# Patient Record
Sex: Female | Born: 1955 | Race: White | Hispanic: Yes | Marital: Married | Smoking: Never smoker
Health system: Southern US, Community
[De-identification: ages and names within clinical notes are randomized; demographics above are authoritative.]

## PROBLEM LIST (undated history)

## (undated) DIAGNOSIS — I10 Essential (primary) hypertension: Secondary | ICD-10-CM

## (undated) DIAGNOSIS — E079 Disorder of thyroid, unspecified: Secondary | ICD-10-CM

## (undated) DIAGNOSIS — M1990 Unspecified osteoarthritis, unspecified site: Secondary | ICD-10-CM

## (undated) DIAGNOSIS — M722 Plantar fascial fibromatosis: Secondary | ICD-10-CM

## (undated) DIAGNOSIS — A150 Tuberculosis of lung: Secondary | ICD-10-CM

## (undated) DIAGNOSIS — E039 Hypothyroidism, unspecified: Secondary | ICD-10-CM

## (undated) DIAGNOSIS — E7800 Pure hypercholesterolemia, unspecified: Secondary | ICD-10-CM

## (undated) HISTORY — PX: EYE SURGERY: SHX253

## (undated) HISTORY — DX: Tuberculosis of lung: A15.0

## (undated) HISTORY — DX: Hypothyroidism, unspecified: E03.9

## (undated) HISTORY — DX: Plantar fascial fibromatosis: M72.2

## (undated) HISTORY — PX: TUBAL LIGATION: SHX77

## (undated) HISTORY — PX: OTHER SURGICAL HISTORY: SHX169

---

## 2001-05-28 ENCOUNTER — Ambulatory Visit (HOSPITAL_COMMUNITY): Admission: RE | Admit: 2001-05-28 | Discharge: 2001-05-28 | Payer: Self-pay | Admitting: Family Medicine

## 2001-05-28 ENCOUNTER — Encounter: Payer: Self-pay | Admitting: Family Medicine

## 2001-06-10 ENCOUNTER — Encounter: Payer: Self-pay | Admitting: Family Medicine

## 2001-06-10 ENCOUNTER — Ambulatory Visit (HOSPITAL_COMMUNITY): Admission: RE | Admit: 2001-06-10 | Discharge: 2001-06-10 | Payer: Self-pay | Admitting: Family Medicine

## 2001-11-30 ENCOUNTER — Ambulatory Visit (HOSPITAL_COMMUNITY): Admission: RE | Admit: 2001-11-30 | Discharge: 2001-11-30 | Payer: Self-pay | Admitting: Family Medicine

## 2001-11-30 ENCOUNTER — Encounter: Payer: Self-pay | Admitting: Family Medicine

## 2004-02-07 ENCOUNTER — Ambulatory Visit (HOSPITAL_COMMUNITY): Admission: RE | Admit: 2004-02-07 | Discharge: 2004-02-07 | Payer: Self-pay | Admitting: Family Medicine

## 2005-02-11 ENCOUNTER — Ambulatory Visit (HOSPITAL_COMMUNITY): Admission: RE | Admit: 2005-02-11 | Discharge: 2005-02-11 | Payer: Self-pay | Admitting: Family Medicine

## 2006-02-16 ENCOUNTER — Ambulatory Visit (HOSPITAL_COMMUNITY): Admission: RE | Admit: 2006-02-16 | Discharge: 2006-02-16 | Payer: Self-pay | Admitting: Family Medicine

## 2007-02-19 ENCOUNTER — Ambulatory Visit (HOSPITAL_COMMUNITY): Admission: RE | Admit: 2007-02-19 | Discharge: 2007-02-19 | Payer: Self-pay | Admitting: Family Medicine

## 2008-03-03 ENCOUNTER — Ambulatory Visit (HOSPITAL_COMMUNITY): Admission: RE | Admit: 2008-03-03 | Discharge: 2008-03-03 | Payer: Self-pay | Admitting: Family Medicine

## 2009-03-07 ENCOUNTER — Ambulatory Visit (HOSPITAL_COMMUNITY): Admission: RE | Admit: 2009-03-07 | Discharge: 2009-03-07 | Payer: Self-pay | Admitting: Family Medicine

## 2010-03-26 ENCOUNTER — Ambulatory Visit (HOSPITAL_COMMUNITY): Admission: RE | Admit: 2010-03-26 | Discharge: 2010-03-26 | Payer: Self-pay | Admitting: Family Medicine

## 2010-09-02 ENCOUNTER — Ambulatory Visit: Payer: Self-pay | Admitting: Internal Medicine

## 2010-09-02 DIAGNOSIS — M129 Arthropathy, unspecified: Secondary | ICD-10-CM | POA: Insufficient documentation

## 2010-09-02 DIAGNOSIS — E039 Hypothyroidism, unspecified: Secondary | ICD-10-CM

## 2010-09-02 DIAGNOSIS — K5909 Other constipation: Secondary | ICD-10-CM | POA: Insufficient documentation

## 2010-09-02 DIAGNOSIS — K921 Melena: Secondary | ICD-10-CM | POA: Insufficient documentation

## 2010-09-03 ENCOUNTER — Encounter: Payer: Self-pay | Admitting: Internal Medicine

## 2010-09-16 ENCOUNTER — Ambulatory Visit (HOSPITAL_COMMUNITY)
Admission: RE | Admit: 2010-09-16 | Discharge: 2010-09-16 | Payer: Self-pay | Source: Home / Self Care | Admitting: Internal Medicine

## 2010-11-12 NOTE — Letter (Signed)
Summary: TCS ORDER  TCS ORDER   Imported By: Ave Filter 09/03/2010 08:06:04  _____________________________________________________________________  External Attachment:    Type:   Image     Comment:   External Document

## 2010-11-14 NOTE — Assessment & Plan Note (Signed)
Summary: RECTAL BLEEDING/LAW   Visit Type:  Initial Consult Referring Provider:  Sherie Don Primary Care Provider:  Sherie Don, NP  Chief Complaint:  rectal bleeding.  History of Present Illness: 55 y/o hispanic female here for evaluation to set up colonoscopy because she noticed a small amt of hematochezia a couple of times, bright fresh blood, with a BM after much straining.  hx chronic constipation w/ daily BM otherwse.  Taking diclofenac daily for arthritis x 6 years.  Hx hypothyroidism dx 1 month ago. Advil 2  1-2 days per week.  Denies proctalgia or abd pain.  The patient denies any upper GI symptoms which include nausea, vomiting, heartburn, indigestion, dysphagia, odynophagia, anorexia or early satiety.  Weight stable.   [employer] [phone]     Current Problems (verified): 1)  Arthritis  (ICD-716.90) 2)  Hematochezia  (ICD-578.1) 3)  Hypothyroidism  (ICD-244.9)  Current Medications (verified): 1)  Levothroid 50 Mcg Tabs (Levothyroxine Sodium) .... Take 1 Tablet By Mouth Once A Day 2)  Voltaren-Xr 100 Mg Xr24h-Tab (Diclofenac Sodium) .... Take 1 Tablet By Mouth Once A Day 3)  Multi-Vitamins .... Take 1 Tablet By Mouth Once A Day  Allergies (verified): No Known Drug Allergies  Past History:  Past Medical History: ARTHRITIS (ICD-716.90) HEMATOCHEZIA (ICD-578.1) HYPOTHYROIDISM (ICD-244.9)  Past Surgical History: Unremarkable  Family History: No known family history of colorectal carcinoma, IBD, liver or chronic GI problems.  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  Alcohol Use - no Illicit Drug Use - no Patient does not get regular exercise.  Does Patient Exercise:  no Smoking Status:  never Drug Use:  no  Review of Systems General:  Denies fever, chills, sweats, anorexia, fatigue, weakness, malaise, weight loss, and sleep disorder. CV:  Denies chest pains, angina, palpitations, syncope, dyspnea  on exertion, orthopnea, PND, peripheral edema, and claudication. GI:  Denies difficulty swallowing, pain on swallowing, indigestion/heartburn, vomiting blood, jaundice, and black BMs. GU:  Denies urinary burning, blood in urine, nocturnal urination, urinary frequency, urinary incontinence, abnormal vaginal bleeding, amenorrhea, menorrhagia, vaginal discharge, pelvic pain, genital sores, painful intercourse, and decreased libido. MS:  Denies joint pain / LOM, joint swelling, joint stiffness, joint deformity, low back pain, muscle weakness, muscle cramps, muscle atrophy, leg pain at night, leg pain with exertion, and shoulder pain / LOM hand / wrist pain (CTS). Derm:  Denies rash, itching, dry skin, hives, moles, warts, and unhealing ulcers. Psych:  Denies depression, anxiety, memory loss, suicidal ideation, hallucinations, paranoia, phobia, and confusion. Heme:  Denies bruising and enlarged lymph nodes.  Vital Signs:  Patient profile:   55 year old female Height:      57 inches Weight:      139 pounds BMI:     30.19 Temp:     98.4 degrees F oral Pulse rate:   64 / minute BP sitting:   120 / 80  (left arm) Cuff size:   regular  Vitals Entered By: Cloria Spring LPN (September 02, 2010 2:22 PM)  Physical Exam  General:  Well developed, well nourished, no acute distress. Head:  Normocephalic and atraumatic. Eyes:  Sclera clear, no icterus. Ears:  Normal auditory acuity. Nose:  No deformity, discharge,  or lesions. Mouth:  No deformity or lesions, dentition normal. Neck:  Supple; no masses or thyromegaly. Chest Wall:  Symmetrical;  no deformities or tenderness. Lungs:  Clear throughout to auscultation. Heart:  Regular rate and rhythm; no murmurs, rubs,  or bruits. Abdomen:  Soft, nontender and nondistended. No masses, hepatosplenomegaly or hernias noted. Normal bowel sounds.without guarding and without rebound.   Rectal:  deferred until time of colonoscopy.   Msk:  Symmetrical with no  gross deformities. Normal posture. Pulses:  Normal pulses noted. Extremities:  No clubbing, cyanosis, edema or deformities noted. Neurologic:  Alert and  oriented x4;  grossly normal neurologically. Skin:  Intact without significant lesions or rashes. Cervical Nodes:  No significant cervical adenopathy. Psych:  Alert and cooperative. Normal mood and affect.  Impression & Recommendations:  Problem # 1:  HEMATOCHEZIA (ICD-578.1)  54 y/o hispanic female w/ intermittant small volume hematochezia in setting of chronic constipation & recent diagnosis of hypothyroidism.  Will need colonoscopy to determine source and r/o colorectal ca.  Diagnostic colonoscopy to be performed by Dr. Suszanne Conners Rourk in the near future.  I have discussed risks and benefits which include, but are not limited to, bleeding, infection, perforation, or medication reaction.  The patient agrees with this plan and consent will be obtained.  Orders: Consultation Level III (44010)  Problem # 2:  CONSTIPATION, CHRONIC (ICD-564.09) Would consider Miralax if continues, she has had daily BMs however since on thyroid meds x 1 month.

## 2011-03-06 ENCOUNTER — Other Ambulatory Visit: Payer: Self-pay | Admitting: Family Medicine

## 2011-03-06 DIAGNOSIS — Z139 Encounter for screening, unspecified: Secondary | ICD-10-CM

## 2011-04-01 ENCOUNTER — Ambulatory Visit (HOSPITAL_COMMUNITY)
Admission: RE | Admit: 2011-04-01 | Discharge: 2011-04-01 | Disposition: A | Discharge status: Home / Self Care | Payer: 59 | Source: Ambulatory Visit | Attending: Family Medicine | Admitting: Family Medicine

## 2011-04-01 DIAGNOSIS — Z139 Encounter for screening, unspecified: Secondary | ICD-10-CM

## 2011-04-01 DIAGNOSIS — Z1231 Encounter for screening mammogram for malignant neoplasm of breast: Secondary | ICD-10-CM | POA: Insufficient documentation

## 2012-04-02 ENCOUNTER — Other Ambulatory Visit: Payer: Self-pay | Admitting: Family Medicine

## 2012-04-02 DIAGNOSIS — Z139 Encounter for screening, unspecified: Secondary | ICD-10-CM

## 2012-04-09 ENCOUNTER — Ambulatory Visit (HOSPITAL_COMMUNITY)
Admission: RE | Admit: 2012-04-09 | Discharge: 2012-04-09 | Disposition: A | Discharge status: Home / Self Care | Payer: 59 | Source: Ambulatory Visit | Attending: Family Medicine | Admitting: Family Medicine

## 2012-04-09 DIAGNOSIS — Z139 Encounter for screening, unspecified: Secondary | ICD-10-CM

## 2012-04-13 ENCOUNTER — Ambulatory Visit (HOSPITAL_COMMUNITY)
Admission: RE | Admit: 2012-04-13 | Discharge: 2012-04-13 | Disposition: A | Discharge status: Home / Self Care | Payer: 59 | Source: Ambulatory Visit | Attending: Family Medicine | Admitting: Family Medicine

## 2012-04-13 DIAGNOSIS — Z1231 Encounter for screening mammogram for malignant neoplasm of breast: Secondary | ICD-10-CM | POA: Insufficient documentation

## 2012-10-21 ENCOUNTER — Emergency Department (HOSPITAL_COMMUNITY)
Admission: EM | Admit: 2012-10-21 | Discharge: 2012-10-21 | Disposition: A | Discharge status: Home / Self Care | Payer: 59 | Attending: Emergency Medicine | Admitting: Emergency Medicine

## 2012-10-21 ENCOUNTER — Emergency Department (HOSPITAL_COMMUNITY): Payer: 59

## 2012-10-21 ENCOUNTER — Encounter (HOSPITAL_COMMUNITY): Payer: Self-pay | Admitting: *Deleted

## 2012-10-21 DIAGNOSIS — G43909 Migraine, unspecified, not intractable, without status migrainosus: Secondary | ICD-10-CM | POA: Insufficient documentation

## 2012-10-21 DIAGNOSIS — Z79899 Other long term (current) drug therapy: Secondary | ICD-10-CM | POA: Insufficient documentation

## 2012-10-21 DIAGNOSIS — H53149 Visual discomfort, unspecified: Secondary | ICD-10-CM | POA: Insufficient documentation

## 2012-10-21 DIAGNOSIS — I10 Essential (primary) hypertension: Secondary | ICD-10-CM | POA: Insufficient documentation

## 2012-10-21 DIAGNOSIS — Z8739 Personal history of other diseases of the musculoskeletal system and connective tissue: Secondary | ICD-10-CM | POA: Insufficient documentation

## 2012-10-21 DIAGNOSIS — E079 Disorder of thyroid, unspecified: Secondary | ICD-10-CM | POA: Insufficient documentation

## 2012-10-21 DIAGNOSIS — E7800 Pure hypercholesterolemia, unspecified: Secondary | ICD-10-CM | POA: Insufficient documentation

## 2012-10-21 HISTORY — DX: Essential (primary) hypertension: I10

## 2012-10-21 HISTORY — DX: Unspecified osteoarthritis, unspecified site: M19.90

## 2012-10-21 HISTORY — DX: Pure hypercholesterolemia, unspecified: E78.00

## 2012-10-21 HISTORY — DX: Disorder of thyroid, unspecified: E07.9

## 2012-10-21 MED ORDER — DEXAMETHASONE SODIUM PHOSPHATE 4 MG/ML IJ SOLN
10.0000 mg | Freq: Once | INTRAMUSCULAR | Status: AC
  Administered 2012-10-21: 12 mg via INTRAMUSCULAR
  Filled 2012-10-21: qty 3

## 2012-10-21 MED ORDER — HYDROCODONE-ACETAMINOPHEN 5-325 MG PO TABS: 1.0000 | ORAL_TABLET | Freq: Four times a day (QID) | ORAL | Status: AC | PRN

## 2012-10-21 MED ORDER — METOCLOPRAMIDE HCL 5 MG/ML IJ SOLN
10.0000 mg | Freq: Once | INTRAMUSCULAR | Status: AC
  Administered 2012-10-21: 10 mg via INTRAMUSCULAR
  Filled 2012-10-21: qty 2

## 2012-10-21 MED ORDER — DIPHENHYDRAMINE HCL 25 MG PO CAPS
50.0000 mg | ORAL_CAPSULE | Freq: Once | ORAL | Status: AC
  Administered 2012-10-21: 50 mg via ORAL
  Filled 2012-10-21: qty 2

## 2012-10-21 NOTE — ED Provider Notes (Signed)
Medical screening examination/treatment/procedure(s) were performed by non-physician practitioner and as supervising physician I was immediately available for consultation/collaboration.   Ayaansh Smail W Jayliah Benett, MD 10/21/12 2353 

## 2012-10-21 NOTE — ED Provider Notes (Signed)
History     CSN: [id number]  Arrival date & time 10/21/12  1901   First MD Initiated Contact with Patient 10/21/12 2104      Chief Complaint  Patient presents with  . Headache    (Consider location/radiation/quality/duration/timing/severity/associated sxs/prior treatment) HPI Comments: States she used to have headaches many years ago and they were always occipital in presentation.  This has been frontal only.  Had a ormal head CT she thinks about 10-15 years ago.    No head trauma.  No change in LOC, behavior or coordination.  Patient is a 57 y.o. female presenting with headaches. The history is provided by the patient. A language interpreter was used (daughter translating spanis/english for pt and me.).  Headache  This is a new problem. Episode onset: 1 wk ago. The problem occurs constantly. The problem has not changed since onset.The headache is associated with bright light. The pain is located in the frontal region. The quality of the pain is described as throbbing. The pain is severe. The pain does not radiate. Pertinent negatives include no fever, no near-syncope, no nausea and no vomiting. She has tried acetaminophen and NSAIDs for the symptoms. The treatment provided no relief.    Past Medical History  Diagnosis Date  . Arthritis   . Thyroid disease   . Hypertension   . Hypercholesteremia     Past Surgical History  Procedure Date  . Eye surgery   . 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 No pertinent family history.  History  Substance Use Topics  . Smoking status: Never Smoker   . Smokeless tobacco: Not on file  . Alcohol Use: No    OB History    Grav Para Term Preterm Abortions TAB SAB Ect Mult Living                  Review of Systems  Constitutional: Negative for fever and chills.  Eyes: Positive for photophobia. Negative for visual disturbance.  Cardiovascular: Negative for near-syncope.  Gastrointestinal: Negative for nausea and  vomiting.  Neurological: Positive for headaches.  Psychiatric/Behavioral: Negative for confusion and decreased concentration.  All other systems reviewed and are negative.    Allergies  Review of patient's allergies indicates no known allergies.  Home Medications   Current Outpatient Rx  Name  Route  Sig  Dispense  Refill  . CARBOXYMETHYLCELLULOSE SODIUM 0.25 % OP SOLN   Ophthalmic   Apply 1 drop to eye daily as needed. For lubricant         . DICLOFENAC SODIUM 75 MG PO TBEC   Oral   Take 75 mg by mouth 2 (two) times daily with a meal.         . OMEGA-3 FATTY ACIDS 1000 MG PO CAPS   Oral   Take 2 g by mouth daily.         Marland Kitchen HYDROCODONE-ACETAMINOPHEN 5-325 MG PO TABS   Oral   Take 1 tablet by mouth every 4 (four) hours as needed. For headache pain         . LEVOTHYROXINE SODIUM 100 MCG PO TABS   Oral   Take 100 mcg by mouth daily.         Marland Kitchen HYDROCODONE-ACETAMINOPHEN 5-325 MG PO TABS   Oral   Take 1 tablet by mouth every 6 (six) hours as needed for pain.   20 tablet   0     BP 127/81  Pulse 93  Temp 99.7  F (37.6 C) (Oral)  Resp 18  Ht 4\' 10"  (1.473 m)  Wt 135 lb (61.236 kg)  BMI 28.22 kg/m2  SpO2 100%  Physical Exam  Nursing note and vitals reviewed. Constitutional: She is oriented to person, place, and time. She appears well-developed and well-nourished. No distress.  HENT:  Head: Normocephalic and atraumatic.  Eyes: EOM are normal. Pupils are equal, round, and reactive to light.  Neck: Normal range of motion.  Cardiovascular: Normal rate and regular rhythm.   Pulmonary/Chest: Effort normal.  Abdominal: Soft. She exhibits no distension. There is no tenderness.  Musculoskeletal: Normal range of motion.  Neurological: She is alert and oriented to person, place, and time. She has normal strength. No cranial nerve deficit or sensory deficit. Coordination and gait normal. GCS eye subscore is 4. GCS verbal subscore is 5. GCS motor subscore is 6.    Skin: Skin is warm and dry.  Psychiatric: She has a normal mood and affect. Judgment normal.    ED Course  Procedures (including critical care time)  Labs Reviewed - No data to display Ct Head Wo Contrast  10/21/2012  *RADIOLOGY REPORT*  Clinical Data: Severe headache for 1 week  CT HEAD WITHOUT CONTRAST  Technique:  Contiguous axial images were obtained from the base of the skull through the vertex without contrast.  Comparison: None.  Findings: There is no evidence for acute infarction, intracranial hemorrhage, mass lesion, hydrocephalus, or extra-axial fluid.  No atrophy or white matter disease.  Calvarium intact.  Clear sinuses and mastoids.  IMPRESSION: Negative exam.   Original Report Authenticated By: Davonna Belling, M.D.      1. Migraine headache       MDM  Nl head CT rx-hydrocodone, 20 F/u with dr. Gerda Diss prn        Evalina Field, PA 10/21/12 2317

## 2012-10-21 NOTE — ED Notes (Signed)
Headache for 1 week, seen by her doctor.Taking vicodin without relief.  Nausea, no vomiting.  No injury to head

## 2013-03-22 ENCOUNTER — Other Ambulatory Visit: Payer: Self-pay | Admitting: Family Medicine

## 2013-03-22 DIAGNOSIS — Z139 Encounter for screening, unspecified: Secondary | ICD-10-CM

## 2013-04-18 ENCOUNTER — Ambulatory Visit (HOSPITAL_COMMUNITY)
Admission: RE | Admit: 2013-04-18 | Discharge: 2013-04-18 | Disposition: A | Discharge status: Home / Self Care | Payer: 59 | Source: Ambulatory Visit | Attending: Family Medicine | Admitting: Family Medicine

## 2013-04-18 DIAGNOSIS — Z1231 Encounter for screening mammogram for malignant neoplasm of breast: Secondary | ICD-10-CM | POA: Insufficient documentation

## 2013-04-18 DIAGNOSIS — Z139 Encounter for screening, unspecified: Secondary | ICD-10-CM

## 2013-05-07 ENCOUNTER — Encounter: Payer: Self-pay | Admitting: *Deleted

## 2013-05-11 ENCOUNTER — Encounter: Payer: Self-pay | Admitting: Nurse Practitioner

## 2013-05-25 ENCOUNTER — Telehealth: Payer: Self-pay | Admitting: Nurse Practitioner

## 2013-05-25 ENCOUNTER — Ambulatory Visit (INDEPENDENT_AMBULATORY_CARE_PROVIDER_SITE_OTHER): Payer: Managed Care, Other (non HMO) | Admitting: Nurse Practitioner

## 2013-05-25 ENCOUNTER — Encounter: Payer: Self-pay | Admitting: Nurse Practitioner

## 2013-05-25 VITALS — BP 118/76 | Ht <= 58 in | Wt 137.8 lb

## 2013-05-25 DIAGNOSIS — Z0000 Encounter for general adult medical examination without abnormal findings: Secondary | ICD-10-CM

## 2013-05-25 DIAGNOSIS — Z01419 Encounter for gynecological examination (general) (routine) without abnormal findings: Secondary | ICD-10-CM

## 2013-05-25 DIAGNOSIS — M722 Plantar fascial fibromatosis: Secondary | ICD-10-CM | POA: Insufficient documentation

## 2013-05-25 DIAGNOSIS — Z79899 Other long term (current) drug therapy: Secondary | ICD-10-CM

## 2013-05-25 DIAGNOSIS — E782 Mixed hyperlipidemia: Secondary | ICD-10-CM

## 2013-05-25 DIAGNOSIS — R5381 Other malaise: Secondary | ICD-10-CM

## 2013-05-25 NOTE — Patient Instructions (Addendum)
Vitamin D 1000 units per day Calcium 1200 mg per dayFascitis plantar (Plantar Fascitis) La fascitis plantar es un trastorno frecuente que ocasiona dolor en el pie. Se trata de una inflamacin (irritacin) de la banda de tejidos fibrosos y resistentes que se extienden desde el hueso del taln (calcneo) hasta la parte anterior de la planta del pie. Esta inflamacin puede deberse a que ha permanecido [employer], a zapatos que no Holiday representative, a correr [employer], al sobrepeso, a un andar anormal y el uso excesivo del pie con dolor (esto es frecuente en los corredores). Tambin es frecuente [employer] que practican ejercicios aerbicos y [place] bailarines de ballet. SNTOMAS La persona que sufre fascitis plantar manifiesta:  Dolor intenso por la [employer]ant parte inferior del pie, especialmente al dar los primeros pasos luego de levantarse de la cama. Este dolor disminuye luego de caminar algunos minutos.  Dolor intenso al caminar [employer] de un tiempo prolongado de inactividad.  El dolor empeora al caminar descalzo o al subir escaleras. DIAGNSTICO  El mdico har el diagnstico examinando sus pies.  En general no es necesario indicar radiografas. PREVENCIN  Consulte a Nurse, mental health en medicina del deporte antes de comenzar un nuevo programa de ejercicios.  Los programas de caminatas ofrecen un buen entrenamiento. Hay una menor probabilidad de sufrir lesiones por uso excesivo, que son frecuentes [place] personas que corren. Hay menos impacto y menos agresin a las articulaciones.  Comience lentamente todo nuevo programa de ejercicios. Si aparece algn problema o dolor, disminuya la cantidad de tiempo o la distancia hasta que se encuentre cmodo.  Use calzado de buena calidad y reemplcelo regularmente.  Estire el pie y los ligamentos que se encuentran en la parte posterior del tobillo (tendn de [place]) antes y despus de Education officer, environmental actividad  fsica.  Corra o practique ejercicios sobre superficies parejas que no sean duras. Por ejemplo, el asfalto es mejor que el pavimento.  No corra descalzo sobre superficies duras.  Si camina sobre cinta, vare la inclinacin.  No siga con el entrenamiento si tiene problemas en el pie o en la articulacin. Busque ayuda profesional si no mejora. INSTRUCCIONES PARA EL CUIDADO DOMICILIARIO  Evite las SUPERVALU INC causan dolor hasta que se recupere.  Use hielo o compresas fras sobre las zonas doloridas despus de Education officer, environmental ejercicios.  Only take over-the-counter or prescription medicines for pain, discomfort, or fever as directed by your caregiver.  Las plantillas blandas o las zapatillas con base de aire o gel pueden ser de [place].  Si los problemas continan o se agravan, consulte a Music therapist en medicina del deporte o con su mdico personal. La cortisona es un potente antiinflamatorio que puede inyectarse en la zona dolorida. El profesional que lo asiste comentar este tratamiento con usted. EST SEGURO QUE:   Comprende las instrucciones para el alta mdica.  Controlar su enfermedad.  Solicitar atencin mdica de inmediato segn las indicaciones. Document Released: 07/09/2005 Document Revised: 12/22/2011 [facility] Patient Information 2014 [place], [place].

## 2013-05-25 NOTE — Telephone Encounter (Signed)
Jamie Torres has chart.   Patient forgot to ask for refills on medication during her PE on 05/25/2013.    levothyroxine (SYNTHROID, LEVOTHROID) 100 MCG tablet  diclofenac (VOLTAREN) 75 MG EC tablet  [facility]  Please call Patient. Thanks

## 2013-05-25 NOTE — Progress Notes (Signed)
  Subjective:    Patient ID: Jamie Torres, female    DOB: 1956-04-07, 57 y.o.   MRN: [id number]  HPI Presents for wellness exam.  Daughter is acting as interpreter. Healthy diet. Regular vision and dental exams.  Pain in right heel. Worse first thing in morning and when first standing.    Review of Systems  Constitutional: Negative for fever, activity change, appetite change and fatigue.  HENT: Negative for hearing loss, ear pain, congestion, sore throat, rhinorrhea and dental problem.   Eyes: Negative for visual disturbance.  Respiratory: Negative for cough, chest tightness and shortness of breath.   Cardiovascular: Negative for chest pain and palpitations.  Gastrointestinal: Negative for nausea, vomiting, abdominal pain, diarrhea and constipation.  Genitourinary: Positive for frequency. Negative for dysuria, urgency, vaginal bleeding, vaginal discharge, difficulty urinating, menstrual problem and pelvic pain.  Musculoskeletal: Positive for myalgias.  Neurological: Negative for headaches.  Psychiatric/Behavioral: Negative for sleep disturbance and dysphoric mood. The patient is not nervous/anxious.        Objective:   Physical Exam  Vitals reviewed. Constitutional: She is oriented to person, place, and time. She appears well-developed. No distress.  HENT:  Right Ear: External ear normal.  Left Ear: External ear normal.  Mouth/Throat: Oropharynx is clear and moist.  Neck: Normal range of motion. Neck supple. No tracheal deviation present. No thyromegaly present.  Cardiovascular: Normal rate, regular rhythm and normal heart sounds.  Exam reveals no gallop.   No murmur heard. Pulmonary/Chest: Effort normal and breath sounds normal. No respiratory distress.  Abdominal: Soft. She exhibits no distension and no mass. There is no tenderness.  Genitourinary: Uterus normal.  Musculoskeletal: She exhibits no edema.  Lymphadenopathy:    She has no cervical adenopathy.  Neurological: She  is alert and oriented to person, place, and time.  Skin: Skin is warm and dry. No rash noted.  Psychiatric: She has a normal mood and affect. Her behavior is normal.  Breast exam: no masses; Axillae no adenopathy. External GU: normal Bimanual exam normal; no masses; ovaries nonpalpable Rectal exam: normal, no stool for hemoccult.  sole of right foot distal part of heel tender to palpation        Assessment & Plan:  Well woman exam  Other malaise and fatigue - Plan: CBC with Differential, TSH, Vitamin D 25 hydroxy  Mixed hyperlipidemia - Plan: Lipid panel  Encounter for long-term (current) use of other medications - Plan: Hepatic function panel, Basic metabolic panel  Routine general medical examination at a health care facility - Plan: POC Hemoccult Bld/Stl (3-Cd Home Screen), CANCELED: MM Digital Screening  Plantar fasciitis of right foot  Given information on plantar fasciitis.  Recommend office visit if persists.  Recommend vitamin D and calcium supplement.  Next PE in one year.

## 2013-05-26 ENCOUNTER — Other Ambulatory Visit: Payer: Self-pay | Admitting: Nurse Practitioner

## 2013-05-26 NOTE — Telephone Encounter (Signed)
Left message at home number on machine that Jamie Torres would like to get labs back first to see if she needs same dose of thyroid medication.

## 2013-05-26 NOTE — Telephone Encounter (Signed)
Would like to get labs back first to see if she needs same dose of thyroid medication

## 2013-05-30 LAB — LIPID PANEL
LDL Cholesterol: 144 mg/dL — ABNORMAL HIGH (ref 0–99)
Total CHOL/HDL Ratio: 6.5 Ratio

## 2013-05-30 LAB — HEPATIC FUNCTION PANEL
Albumin: 4 g/dL (ref 3.5–5.2)
Indirect Bilirubin: 0.3 mg/dL (ref 0.0–0.9)
Total Bilirubin: 0.4 mg/dL (ref 0.3–1.2)
Total Protein: 6.7 g/dL (ref 6.0–8.3)

## 2013-05-30 LAB — CBC WITH DIFFERENTIAL/PLATELET
Eosinophils Relative: 2 % (ref 0–5)
Hemoglobin: 13.3 g/dL (ref 12.0–15.0)
Lymphocytes Relative: 30 % (ref 12–46)
Lymphs Abs: 2 10*3/uL (ref 0.7–4.0)
MCHC: 33.1 g/dL (ref 30.0–36.0)
MCV: 86.3 fL (ref 78.0–100.0)
Monocytes Absolute: 0.5 10*3/uL (ref 0.1–1.0)
Platelets: 239 10*3/uL (ref 150–400)
RBC: 4.66 MIL/uL (ref 3.87–5.11)
RDW: 14.7 % (ref 11.5–15.5)
WBC: 6.9 10*3/uL (ref 4.0–10.5)

## 2013-05-30 LAB — BASIC METABOLIC PANEL
Calcium: 8.9 mg/dL (ref 8.4–10.5)
Chloride: 106 mEq/L (ref 96–112)
Potassium: 4.4 mEq/L (ref 3.5–5.3)
Sodium: 139 mEq/L (ref 135–145)

## 2013-05-30 LAB — TSH: TSH: 1.149 u[IU]/mL (ref 0.350–4.500)

## 2013-06-02 ENCOUNTER — Encounter: Payer: Self-pay | Admitting: Nurse Practitioner

## 2013-06-03 ENCOUNTER — Other Ambulatory Visit (INDEPENDENT_AMBULATORY_CARE_PROVIDER_SITE_OTHER): Payer: Managed Care, Other (non HMO) | Admitting: *Deleted

## 2013-06-03 DIAGNOSIS — Z0000 Encounter for general adult medical examination without abnormal findings: Secondary | ICD-10-CM

## 2013-06-03 LAB — POC HEMOCCULT BLD/STL (HOME/3-CARD/SCREEN): Fecal Occult Blood, POC: NEGATIVE

## 2013-07-01 ENCOUNTER — Other Ambulatory Visit: Payer: Self-pay | Admitting: Family Medicine

## 2013-07-01 NOTE — Telephone Encounter (Signed)
Last office visit 05/25/13

## 2014-03-16 ENCOUNTER — Other Ambulatory Visit: Payer: Self-pay | Admitting: Family Medicine

## 2014-03-16 DIAGNOSIS — Z1231 Encounter for screening mammogram for malignant neoplasm of breast: Secondary | ICD-10-CM

## 2014-04-16 ENCOUNTER — Other Ambulatory Visit: Payer: Self-pay | Admitting: Family Medicine

## 2014-04-20 ENCOUNTER — Ambulatory Visit (HOSPITAL_COMMUNITY)
Admission: RE | Admit: 2014-04-20 | Discharge: 2014-04-20 | Disposition: A | Discharge status: Home / Self Care | Payer: BC Managed Care – PPO | Source: Ambulatory Visit | Attending: Family Medicine | Admitting: Family Medicine

## 2014-04-20 DIAGNOSIS — Z1231 Encounter for screening mammogram for malignant neoplasm of breast: Secondary | ICD-10-CM

## 2014-04-24 ENCOUNTER — Other Ambulatory Visit: Payer: Self-pay

## 2014-04-24 MED ORDER — LEVOTHYROXINE SODIUM 100 MCG PO TABS: ORAL_TABLET | ORAL | Status: DC

## 2014-05-25 ENCOUNTER — Other Ambulatory Visit: Payer: Self-pay | Admitting: Family Medicine

## 2014-06-28 ENCOUNTER — Other Ambulatory Visit: Payer: Self-pay | Admitting: Family Medicine

## 2014-07-17 ENCOUNTER — Encounter: Payer: Managed Care, Other (non HMO) | Admitting: Nurse Practitioner

## 2014-07-27 ENCOUNTER — Encounter: Payer: Self-pay | Admitting: Nurse Practitioner

## 2014-07-27 ENCOUNTER — Ambulatory Visit (INDEPENDENT_AMBULATORY_CARE_PROVIDER_SITE_OTHER): Payer: BC Managed Care – PPO | Admitting: Nurse Practitioner

## 2014-07-27 VITALS — BP 120/80 | Ht <= 58 in | Wt 138.0 lb

## 2014-07-27 DIAGNOSIS — Z0000 Encounter for general adult medical examination without abnormal findings: Secondary | ICD-10-CM

## 2014-07-27 DIAGNOSIS — E782 Mixed hyperlipidemia: Secondary | ICD-10-CM

## 2014-07-27 DIAGNOSIS — G4700 Insomnia, unspecified: Secondary | ICD-10-CM

## 2014-07-27 DIAGNOSIS — Z01419 Encounter for gynecological examination (general) (routine) without abnormal findings: Secondary | ICD-10-CM

## 2014-07-27 DIAGNOSIS — Z79899 Other long term (current) drug therapy: Secondary | ICD-10-CM

## 2014-07-27 DIAGNOSIS — K5909 Other constipation: Secondary | ICD-10-CM

## 2014-07-27 DIAGNOSIS — E039 Hypothyroidism, unspecified: Secondary | ICD-10-CM

## 2014-07-27 MED ORDER — ALPRAZOLAM 0.5 MG PO TABS: ORAL_TABLET | ORAL | Status: DC

## 2014-07-28 ENCOUNTER — Encounter: Payer: Self-pay | Admitting: Nurse Practitioner

## 2014-07-28 DIAGNOSIS — G4700 Insomnia, unspecified: Secondary | ICD-10-CM | POA: Insufficient documentation

## 2014-07-28 LAB — TSH: TSH: 1.544 u[IU]/mL (ref 0.350–4.500)

## 2014-07-28 LAB — LIPID PANEL
CHOL/HDL RATIO: 5.7 ratio
CHOLESTEROL: 205 mg/dL — AB (ref 0–200)
HDL: 36 mg/dL — AB (ref >39)
LDL Cholesterol: 136 mg/dL — ABNORMAL HIGH (ref 0–99)
Triglycerides: 163 mg/dL — ABNORMAL HIGH (ref <150)
VLDL: 33 mg/dL (ref 0–40)

## 2014-07-28 LAB — BASIC METABOLIC PANEL
BUN: 23 mg/dL (ref 6–23)
CHLORIDE: 108 meq/L (ref 96–112)
CO2: 23 meq/L (ref 19–32)
Calcium: 8.8 mg/dL (ref 8.4–10.5)
Creat: 0.71 mg/dL (ref 0.50–1.10)
Glucose, Bld: 88 mg/dL (ref 70–99)
POTASSIUM: 4.1 meq/L (ref 3.5–5.3)
Sodium: 138 mEq/L (ref 135–145)

## 2014-07-28 LAB — HEPATIC FUNCTION PANEL
ALT: 38 U/L — ABNORMAL HIGH (ref 0–35)
AST: 26 U/L (ref 0–37)
Albumin: 3.8 g/dL (ref 3.5–5.2)
Alkaline Phosphatase: 79 U/L (ref 39–117)
BILIRUBIN INDIRECT: 0.3 mg/dL (ref 0.2–1.2)
Bilirubin, Direct: 0.1 mg/dL (ref 0.0–0.3)
Total Bilirubin: 0.4 mg/dL (ref 0.2–1.2)
Total Protein: 6.6 g/dL (ref 6.0–8.3)

## 2014-07-28 NOTE — Progress Notes (Signed)
Subjective:    Patient ID: Jamie Torres, female    DOB: Jan 16, 1956, 58 y.o.   MRN: [id number]  HPI presents for her wellness exam. Her daughter is here as interpreter. Regular vision and dental exams. Married, same sexual partner. No vaginal bleeding or pelvic pain. Eats healthy diet. Regular walking. Having trouble sleeping, specifically early morning awakenings.   Review of Systems  Constitutional: Negative for fever, activity change, appetite change and fatigue.  HENT: Negative for dental problem, ear pain, sinus pressure and sore throat.   Respiratory: Negative for cough, chest tightness, shortness of breath and wheezing.   Cardiovascular: Negative for chest pain.  Gastrointestinal: Positive for constipation. Negative for nausea, vomiting, abdominal pain, diarrhea, blood in stool and abdominal distention.       Dietary measures and MiraLAX used to control her constipation.  Genitourinary: Negative for dysuria, urgency, frequency, vaginal bleeding, vaginal discharge, enuresis, difficulty urinating, genital sores and pelvic pain.  Psychiatric/Behavioral: Positive for sleep disturbance.       Objective:   Physical Exam  Vitals reviewed. Constitutional: She is oriented to person, place, and time. She appears well-developed. No distress.  HENT:  Right Ear: External ear normal.  Left Ear: External ear normal.  Mouth/Throat: Oropharynx is clear and moist.  Neck: Normal range of motion. Neck supple. No tracheal deviation present. No thyromegaly present.  Cardiovascular: Normal rate, regular rhythm and normal heart sounds.  Exam reveals no gallop.   No murmur heard. Pulmonary/Chest: Effort normal and breath sounds normal.  Abdominal: Soft. She exhibits no distension. There is no tenderness.  Genitourinary: Vagina normal and uterus normal.  External GU: No rashes or lesions noted. Bimanual exam no tenderness or obvious masses. Rectal exam no masses, no stool for Hemoccult.    Musculoskeletal: She exhibits no edema.  Lymphadenopathy:    She has no cervical adenopathy.  Neurological: She is alert and oriented to person, place, and time.  Skin: Skin is warm and dry. No rash noted.  Psychiatric: She has a normal mood and affect. Her behavior is normal.   breast exam: No masses, axillae no adenopathy.        Assessment & Plan:   Problem List Items Addressed This Visit     Digestive   CONSTIPATION, CHRONIC     Endocrine   Hypothyroidism   Relevant Orders      TSH     Other   Mixed hyperlipidemia   Relevant Orders      Lipid panel    Other Visit Diagnoses   Well woman exam    -  Primary    Insomnia        High risk medication use        Relevant Orders       Hepatic function panel       Basic metabolic panel      Meds ordered this encounter  Medications  . ALPRAZolam (XANAX) 0.5 MG tablet    Sig: One po qhs prn sleep    Dispense:  30 tablet    Refill:  2    Order Specific Question:  Supervising Provider    Answer:  Merlyn AlbertLUKING, WILLIAM S [[id number]]   Trial of Xanax a half to one at bedtime for sleep. Call back if no improvement. Encouraged healthy diet regular activity and daily calcium/vitamin D supplementation. Continue current measures for constipation, call back if any problems. Recommend flu vaccine and tetanus up-to-date through local pharmacy. Return in about 1 year (around 07/28/2015).

## 2014-08-06 ENCOUNTER — Other Ambulatory Visit: Payer: Self-pay | Admitting: Family Medicine

## 2014-08-18 ENCOUNTER — Other Ambulatory Visit: Payer: Self-pay | Admitting: *Deleted

## 2014-08-18 DIAGNOSIS — Z01419 Encounter for gynecological examination (general) (routine) without abnormal findings: Secondary | ICD-10-CM

## 2014-08-18 LAB — POC HEMOCCULT BLD/STL (HOME/3-CARD/SCREEN)
Card #3 Fecal Occult Blood, POC: NEGATIVE
FECAL OCCULT BLD: NEGATIVE
FECAL OCCULT BLD: NEGATIVE

## 2014-08-25 ENCOUNTER — Encounter: Payer: Self-pay | Admitting: Nurse Practitioner

## 2014-08-25 ENCOUNTER — Ambulatory Visit (INDEPENDENT_AMBULATORY_CARE_PROVIDER_SITE_OTHER): Payer: BC Managed Care – PPO | Admitting: Nurse Practitioner

## 2014-08-25 VITALS — BP 132/78 | Ht 58.5 in | Wt 138.0 lb

## 2014-08-25 DIAGNOSIS — F5104 Psychophysiologic insomnia: Secondary | ICD-10-CM

## 2014-08-25 DIAGNOSIS — G4700 Insomnia, unspecified: Secondary | ICD-10-CM

## 2014-08-26 ENCOUNTER — Encounter: Payer: Self-pay | Admitting: Nurse Practitioner

## 2014-08-26 DIAGNOSIS — F5104 Psychophysiologic insomnia: Secondary | ICD-10-CM | POA: Insufficient documentation

## 2014-08-26 NOTE — Progress Notes (Signed)
Subjective:  Presents with her son to discuss her chronic insomnia. Son is providing interpretation. Patient speaks minimal English. Taking Xanax a few days a week for sleep which is working very well. No caffeine intake. Does take her thyroid medicine at night. Denies any excessive stress.  Objective:   BP 132/78 mmHg  Ht 4' 10.5" (1.486 m)  Wt 138 lb (62.596 kg)  BMI 28.35 kg/m2 NAD. Alert, oriented. Cheerful affect. Lungs clear. Heart regular rate rhythm.  Assessment: Chronic insomnia  Plan: start taking levothyroxine in the morning to make sure this is not a factor in her insomnia. Takes Xanax when necessary sleep. Addressed concerns about potential addiction. Explained the Xanax is in the same class as other sleep agents. Also she is on low-dose at this time. Return in about 6 months (around 02/23/2015).

## 2014-10-18 ENCOUNTER — Other Ambulatory Visit: Payer: Self-pay | Admitting: Family Medicine

## 2014-12-26 ENCOUNTER — Other Ambulatory Visit: Payer: Self-pay | Admitting: Nurse Practitioner

## 2015-01-15 ENCOUNTER — Ambulatory Visit (INDEPENDENT_AMBULATORY_CARE_PROVIDER_SITE_OTHER): Payer: BLUE CROSS/BLUE SHIELD | Admitting: Family Medicine

## 2015-01-15 VITALS — BP 118/74 | Temp 97.8°F | Ht 58.5 in | Wt 139.0 lb

## 2015-01-15 DIAGNOSIS — J310 Chronic rhinitis: Secondary | ICD-10-CM

## 2015-01-15 DIAGNOSIS — J329 Chronic sinusitis, unspecified: Secondary | ICD-10-CM | POA: Diagnosis not present

## 2015-01-15 MED ORDER — AMOXICILLIN 500 MG PO CAPS: 500.0000 mg | ORAL_CAPSULE | Freq: Three times a day (TID) | ORAL | Status: DC

## 2015-01-15 MED ORDER — BENZONATATE 100 MG PO CAPS: 100.0000 mg | ORAL_CAPSULE | Freq: Three times a day (TID) | ORAL | Status: DC | PRN

## 2015-01-15 NOTE — Progress Notes (Signed)
   Subjective:    Patient ID: Jamie Torres, female    DOB: Sep 11, 1956, 59 y.o.   MRN: [id number]  Cough This is a new problem. Episode onset: 1 week ago. Associated symptoms include ear pain, headaches and a sore throat. Treatments tried: robitussium.   Cough productive Traveling and exp to sick folks  Plain robitussinsome prod cough and sob with exdrtion   Symptoms now for nearly 2 weeks Review of Systems  HENT: Positive for ear pain and sore throat.   Respiratory: Positive for cough.   Neurological: Positive for headaches.       Objective:   Physical Exam  Alert mild malaise. Vitals stable. HEENT moderate nasal congestion voice hoarse. Tender anterior nodes. Lungs bronchial cough no wheezes heart regular in rhythm      Assessment & Plan:  Impression rhinosinusitis/bronchitis post viral infection plan amoxicillin 500 3 times a day 10 days. Tessalon Perles when necessary for cough symptom care discussed WSL

## 2015-02-01 ENCOUNTER — Ambulatory Visit (INDEPENDENT_AMBULATORY_CARE_PROVIDER_SITE_OTHER): Payer: BLUE CROSS/BLUE SHIELD | Admitting: Family Medicine

## 2015-02-01 ENCOUNTER — Encounter: Payer: Self-pay | Admitting: Family Medicine

## 2015-02-01 VITALS — BP 110/68 | Temp 97.6°F | Ht 58.5 in | Wt 136.0 lb

## 2015-02-01 DIAGNOSIS — J329 Chronic sinusitis, unspecified: Secondary | ICD-10-CM | POA: Diagnosis not present

## 2015-02-01 DIAGNOSIS — J310 Chronic rhinitis: Secondary | ICD-10-CM

## 2015-02-01 MED ORDER — CLARITHROMYCIN 500 MG PO TABS: 500.0000 mg | ORAL_TABLET | Freq: Two times a day (BID) | ORAL | Status: AC

## 2015-02-01 NOTE — Progress Notes (Signed)
   Subjective:    Patient ID: Marthann SchillerCristina Henkes, female    DOB: 04-03-1956, 59 y.o.   MRN: [id number]  Cough This is a new problem. The current episode started in the past 7 days. Associated symptoms include a sore throat.    Started better not completely off the last dose of meds. See prior notes.  Headache frontal in nature.  Diminished energy  Coughing worse in the past few wks,  Bad at night  Cough no sig porductibve  Review of Systems  HENT: Positive for sore throat.   Respiratory: Positive for cough.        Objective:   Physical Exam  Alert vital stable HEENT moderate nasal congestion frontal tenderness pharynx normal neck supple lungs intermittent bronchial cough heart regular in rhythm      Assessment & Plan:  Impression rhinosinusitis/bronchitis plan antibiotics prescribed. Symptom care discussed. Warning signs discussed WSL

## 2015-03-14 ENCOUNTER — Ambulatory Visit (INDEPENDENT_AMBULATORY_CARE_PROVIDER_SITE_OTHER): Payer: BLUE CROSS/BLUE SHIELD | Admitting: Family Medicine

## 2015-03-14 ENCOUNTER — Encounter: Payer: Self-pay | Admitting: Family Medicine

## 2015-03-14 VITALS — BP 136/78 | Temp 97.9°F | Ht 58.5 in | Wt 139.0 lb

## 2015-03-14 DIAGNOSIS — N3000 Acute cystitis without hematuria: Secondary | ICD-10-CM | POA: Diagnosis not present

## 2015-03-14 DIAGNOSIS — R300 Dysuria: Secondary | ICD-10-CM | POA: Diagnosis not present

## 2015-03-14 MED ORDER — CIPROFLOXACIN HCL 500 MG PO TABS: 500.0000 mg | ORAL_TABLET | Freq: Two times a day (BID) | ORAL | Status: DC

## 2015-03-14 NOTE — Progress Notes (Signed)
   Subjective:    Patient ID: Jamie SchillerCristina Torres, female    DOB: [birth year], 59 y.o.   MRN: [id number]  HPI Patient with dysuria urinary frequency symptoms over the past few days denies high fever chills sweats denies hematuria denies nausea vomiting relates some lower abdominal discomfort has had this problem for   Review of Systems    see above Objective:   Physical Exam  Lungs are clear hearts regular pulse normal extremities no edema skin warm dry lower abdominal slight tenderness no tenderness urinalysis with wbc's      Assessment & Plan:  Urine culture, antibodies prescribed, warning signs discussed, follow-up of ongoing troubles.

## 2015-03-14 NOTE — Progress Notes (Deleted)
   Subjective:    Patient ID: Jamie SchillerCristina Torres, female    DOB: 1955/11/15, 59 y.o.   MRN: [id number]  Abdominal Pain This is a new problem. Episode onset: 3 -4 days ago. Associated symptoms comments: Low abd pain, back pain, frequent urination,. Treatments tried: azo. The treatment provided mild relief.      Review of Systems  Gastrointestinal: Positive for abdominal pain.       Objective:   Physical Exam        Assessment & Plan:

## 2015-03-14 NOTE — Patient Instructions (Signed)
Infección urinaria  °(Urinary Tract Infection) ° La infección urinaria puede ocurrir en cualquier lugar del tracto urinario. El tracto urinario es un sistema de drenaje del cuerpo por el que se eliminan los desechos y el exceso de agua. El tracto urinario está formado por dos riñones, dos uréteres, la vejiga y la uretra. Los riñones son órganos que tienen forma de frijol. Cada riñón tiene aproximadamente el tamaño del puño. Están situados debajo de las costillas, uno a cada lado de la columna vertebral °CAUSAS  °La causa de la infección son los microbios, que son organismos microscópicos, que incluyen hongos, virus, y bacterias. Estos organismos son tan pequeños que sólo pueden verse a través del microscopio. Las bacterias son los microorganismos que más comúnmente causan infecciones urinarias.  °SÍNTOMAS  °Los síntomas pueden variar según la edad y el sexo del paciente y por la ubicación de la infección. Los síntomas en las mujeres jóvenes incluyen la necesidad frecuente e intensa de orinar y una sensación dolorosa de ardor en la vejiga o en la uretra durante la micción. Las mujeres y los hombres mayores podrán sentir cansancio, temblores y debilidad y sentir dolores musculares y dolor abdominal. Si tiene fiebre, puede significar que la infección está en los riñones. Otros síntomas son dolor en la espalda o en los lados debajo de las costillas, náuseas y vómitos.  °DIAGNÓSTICO  °Para diagnosticar una infección urinaria, el médico le preguntará acerca de sus síntomas. También le solicitará una muestra de orina. La muestra de orina se analiza para detectar bacterias y glóbulos blancos de la sangre. Los glóbulos blancos se forman en el organismo para ayudar a combatir las infecciones.  °TRATAMIENTO  °Por lo general, las infecciones urinarias pueden tratarse con medicamentos. Debido a que la mayoría de las infecciones son causadas por bacterias, por lo general pueden tratarse con antibióticos. La elección del  antibiótico y la duración del tratamiento dependerá de sus síntomas y el tipo de bacteria causante de la infección.  °INSTRUCCIONES PARA EL CUIDADO EN EL HOGAR  °· Si le recetaron antibióticos, tómelos exactamente como su médico le indique. Termine el medicamento aunque se sienta mejor después de haber tomado sólo algunos. °· Beba gran cantidad de líquido para mantener la orina de tono claro o color amarillo pálido. °· Evite la cafeína, el té y las bebidas gaseosas. Estas sustancias irritan la vejiga. °· Vaciar la vejiga con frecuencia. Evite retener la orina durante largos períodos. °· Vacíe la vejiga antes y después de tener relaciones sexuales. °· Después de mover el intestino, las mujeres deben higienizarse la región perineal desde adelante hacia atrás. Use sólo un papel tissue por vez. °SOLICITE ATENCIÓN MÉDICA SI:  °· Siente dolor en la espalda. °· Le sube la fiebre. °· Los síntomas no mejoran luego de 3 días. °SOLICITE ATENCIÓN MÉDICA DE INMEDIATO SI:  °· Siente dolor intenso en la espalda o en la zona inferior del abdomen. °· Comienza a sentir escalofríos. °· Tiene náuseas o vómitos. °· Tiene una sensación continua de quemazón o molestias al orinar. °ASEGÚRESE DE QUE:  °· Comprende estas instrucciones. °· Controlará su enfermedad. °· Solicitará ayuda de inmediato si no mejora o empeora. °Document Released: 07/09/2005 Document Revised: 06/23/2012 °ExitCare® Patient Information ©2015 [employer]. This information is not intended to replace advice given to you by your health care provider. Make sure you discuss any questions you have with your health care provider. ° °

## 2015-03-16 LAB — URINE CULTURE: Organism ID, Bacteria: NO GROWTH

## 2015-03-27 ENCOUNTER — Other Ambulatory Visit: Payer: Self-pay | Admitting: Family Medicine

## 2015-04-13 ENCOUNTER — Other Ambulatory Visit: Payer: Self-pay | Admitting: Family Medicine

## 2015-04-13 DIAGNOSIS — Z1231 Encounter for screening mammogram for malignant neoplasm of breast: Secondary | ICD-10-CM

## 2015-04-20 ENCOUNTER — Ambulatory Visit (HOSPITAL_COMMUNITY): Payer: 59

## 2015-04-26 ENCOUNTER — Ambulatory Visit (HOSPITAL_COMMUNITY)
Admission: RE | Admit: 2015-04-26 | Discharge: 2015-04-26 | Disposition: A | Discharge status: Home / Self Care | Payer: BLUE CROSS/BLUE SHIELD | Source: Ambulatory Visit | Attending: Family Medicine | Admitting: Family Medicine

## 2015-04-26 DIAGNOSIS — Z1231 Encounter for screening mammogram for malignant neoplasm of breast: Secondary | ICD-10-CM | POA: Diagnosis present

## 2015-06-13 ENCOUNTER — Other Ambulatory Visit: Payer: Self-pay | Admitting: Nurse Practitioner

## 2015-07-25 ENCOUNTER — Telehealth: Payer: Self-pay | Admitting: Family Medicine

## 2015-07-25 DIAGNOSIS — E039 Hypothyroidism, unspecified: Secondary | ICD-10-CM

## 2015-07-25 DIAGNOSIS — Z79899 Other long term (current) drug therapy: Secondary | ICD-10-CM

## 2015-07-25 DIAGNOSIS — Z13220 Encounter for screening for lipoid disorders: Secondary | ICD-10-CM

## 2015-07-25 NOTE — Telephone Encounter (Signed)
Lipid, liver, metabolic 7, TSH 

## 2015-07-25 NOTE — Telephone Encounter (Signed)
bw orders for upcoming appt please  Last labs 07/27/14 Lip, Hep, BMP, TSH

## 2015-07-26 NOTE — Telephone Encounter (Signed)
Patient notified that bloodwork has been ordered.  

## 2015-07-31 LAB — HEPATIC FUNCTION PANEL
ALK PHOS: 91 IU/L (ref 39–117)
ALT: 39 IU/L — ABNORMAL HIGH (ref 0–32)
AST: 23 IU/L (ref 0–40)
Albumin: 4.1 g/dL (ref 3.6–4.8)
BILIRUBIN TOTAL: 0.3 mg/dL (ref 0.0–1.2)
Bilirubin, Direct: 0.1 mg/dL (ref 0.00–0.40)
Total Protein: 7.2 g/dL (ref 6.0–8.5)

## 2015-07-31 LAB — BASIC METABOLIC PANEL
BUN/Creatinine Ratio: 31 — ABNORMAL HIGH (ref 11–26)
BUN: 21 mg/dL (ref 8–27)
CO2: 21 mmol/L (ref 18–29)
CREATININE: 0.67 mg/dL (ref 0.57–1.00)
Calcium: 9.3 mg/dL (ref 8.7–10.3)
Chloride: 104 mmol/L (ref 97–106)
GFR calc non Af Amer: 96 mL/min/{1.73_m2} (ref >59)
GFR, EST AFRICAN AMERICAN: 110 mL/min/{1.73_m2} (ref >59)
Glucose: 95 mg/dL (ref 65–99)
POTASSIUM: 4.5 mmol/L (ref 3.5–5.2)
SODIUM: 140 mmol/L (ref 136–144)

## 2015-07-31 LAB — LIPID PANEL
CHOL/HDL RATIO: 5.3 ratio — AB (ref 0.0–4.4)
Cholesterol, Total: 221 mg/dL — ABNORMAL HIGH (ref 100–199)
HDL: 42 mg/dL (ref >39)
LDL Calculated: 150 mg/dL — ABNORMAL HIGH (ref 0–99)
TRIGLYCERIDES: 147 mg/dL (ref 0–149)
VLDL Cholesterol Cal: 29 mg/dL (ref 5–40)

## 2015-07-31 LAB — TSH: TSH: 6.59 u[IU]/mL — ABNORMAL HIGH (ref 0.450–4.500)

## 2015-08-02 ENCOUNTER — Ambulatory Visit (INDEPENDENT_AMBULATORY_CARE_PROVIDER_SITE_OTHER): Payer: BLUE CROSS/BLUE SHIELD | Admitting: Nurse Practitioner

## 2015-08-02 ENCOUNTER — Encounter: Payer: Self-pay | Admitting: Nurse Practitioner

## 2015-08-02 VITALS — BP 124/80 | Ht 58.5 in | Wt 139.0 lb

## 2015-08-02 DIAGNOSIS — E039 Hypothyroidism, unspecified: Secondary | ICD-10-CM

## 2015-08-02 DIAGNOSIS — R8290 Unspecified abnormal findings in urine: Secondary | ICD-10-CM | POA: Diagnosis not present

## 2015-08-02 DIAGNOSIS — Z23 Encounter for immunization: Secondary | ICD-10-CM

## 2015-08-02 DIAGNOSIS — E782 Mixed hyperlipidemia: Secondary | ICD-10-CM

## 2015-08-02 LAB — POCT URINALYSIS DIPSTICK: PH UA: 7

## 2015-08-02 MED ORDER — LEVOTHYROXINE SODIUM 112 MCG PO TABS: 112.0000 ug | ORAL_TABLET | Freq: Every day | ORAL | Status: DC

## 2015-08-02 MED ORDER — SULFAMETHOXAZOLE-TRIMETHOPRIM 800-160 MG PO TABS: 1.0000 | ORAL_TABLET | Freq: Two times a day (BID) | ORAL | Status: DC

## 2015-08-02 NOTE — Patient Instructions (Signed)
Dieta restringida en grasas y colesterol  (Fat and Cholesterol Restricted Diet)  El exceso de grasas y colesterol en la dieta puede causar problemas de salud. Esta dieta lo ayudará a mantener las grasas y el colesterol en los niveles normales para evitar enfermarse.  ¿QUÉ TIPOS DE GRASAS DEBO ELEGIR?  · Elija grasas monosaturadas y polinsaturadas. Estas se encuentran en alimentos como el aceite de oliva, aceite de canola, semillas de lino, nueces, almendras y semillas.  · Consuma más grasas omega-3. Las mejores opciones incluyen salmón, caballa, sardinas, atún, aceite de lino y semillas de lino molidas.  · Limite el consumo de grasas saturadas, que se encuentran en productos de origen animal, como carnes, mantequilla y crema. También pueden estar en productos vegetales, como aceite de palma, de palmiste y de coco.  ·  Evite los alimentos con aceites parcialmente hidrogenados. Estos contienen grasas trans. Entre los ejemplos de alimentos con grasas trans se incluyen margarinas en barra, algunas margarinas untables, galletas dulces o saladas y otros productos horneados.  ¿QUÉ PAUTAS GENERALES DEBO SEGUIR?   · Lea las etiquetas de los alimentos. Busque las palabras "grasas trans" y "grasas saturadas".  · Al preparar una comida:    Llene la mitad del plato con verduras y ensaladas de hojas verdes.    Llene un cuarto del plato con cereales integrales. Busque la palabra "integral" en el primer lugar de la lista de ingredientes.    Llene un cuarto del plato con alimentos con proteínas magras.  · Limite las frutas a dos porciones por día. Elija frutas en lugar de jugos.  · Coma más alimentos con fibra soluble, por ejemplo, manzanas, brócoli, zanahorias, frijoles, guisantes y cebada. Trate de consumir de 20 a 30 g (gramos) de fibra por día.  · Coma más comidas caseras. Coma menos en los restaurantes y los bares.  · Limite o evite el alcohol.  · Limite los alimentos con alto contenido de almidón y azúcar.  · Limite el consumo  de alimentos fritos.  · Cocine los alimentos sin freírlos. Las opciones de cocción más adecuadas son hornear, hervir, grillar y asar a la parrilla.  · Baje de peso si es necesario. Aunque pierda poco peso, esto puede ser importante para la salud general. También puede ayudar a prevenir enfermedades como diabetes y enfermedad cardíaca.  ¿QUÉ ALIMENTOS PUEDO COMER?  Cereales  Cereales integrales, como los panes de salvado o integrales, las galletas, los cereales y las pastas. Avena sin endulzar, trigo, cebada, quinua o arroz integral. Tortillas de harina de maíz o de salvado.  Verduras  Verduras frescas o congeladas (crudas, al vapor, asadas o grilladas). Ensaladas de hojas verdes.  Frutas  Frutas frescas, en conserva (en su jugo natural) o frutas congeladas.  Carnes y otros productos con proteínas  Carne de res molida (al 85 % o más magra), carne de res de animales alimentados con pastos o carne de res sin la grasa. Pollo o pavo sin piel. Carne de pollo o de pavo molida. Cerdo sin la grasa. Todos los pescados y frutos de mar. Huevos. Porotos, guisantes o lentejas secos. Frutos secos o semillas sin sal. Frijoles secos o en lata sin sal.  Lácteos  Productos lácteos con bajo contenido de grasas, como leche descremada o al 1 %, quesos reducidos en grasas o al 2 %, ricota con bajo contenido de grasas o queso cottage, o yogur natural con bajo contenido de grasas.  Grasas y aceites  Margarinas untables que no contengan grasas trans. Mayonesa y condimentos para ensaladas livianos o   reducidos en grasas. [place]. Aceites de oliva, canola, sésamo o cártamo. Mantequilla natural de cacahuate o almendra (elija la que no tenga agregado de aceite o azúcar).  Los artículos mencionados arriba pueden no ser una lista completa de las bebidas o los alimentos recomendados. Comuníquese con el nutricionista para conocer más opciones.  ¿QUÉ ALIMENTOS NO SE RECOMIENDAN?  Cereales  Pan blanco. Pastas blancas. Arroz blanco. Pan de maíz.  Bagels, pasteles y croissants. Galletas saladas que contengan grasas trans.  Verduras  Papas blancas. Maíz. Verduras con crema o fritas. Verduras en salsa de queso.  Frutas  Frutas secas. Fruta enlatada en almíbar liviano o espeso. Jugo de frutas.  Carnes y otros productos con proteínas  Cortes de carne con grasa. Costillas, alas de pollo, tocineta, salchicha, mortadela, salame, chinchulines, tocino, perros calientes, salchichas alemanas y embutidos envasados. Hígado y otros órganos.  Lácteos  Leche entera o al 2 %, crema, mezcla de leche y crema, y queso crema. Quesos enteros. Yogur entero o endulzado. Quesos con toda su grasa. Cremas no lácteas y coberturas batidas. Quesos procesados, quesos para untar o cuajadas.  Dulces y postres  Jarabe de maíz, azúcares, miel y melazas. Caramelos. Mermelada y jalea. Jarabe. Cereales endulzados. Galletas, pasteles, bizcochuelos, donas, muffins y helado.  Grasas y aceites  Mantequilla, margarina en barra, manteca de cerdo, grasa, mantequilla clarificada o grasa de tocino. Aceites de coco, de palmiste o de palma.  Bebidas  Alcohol. Bebidas endulzadas (como refrescos, limonadas y bebidas frutales o ponches).  Los artículos mencionados arriba pueden no ser una lista completa de las bebidas y los alimentos que se deben evitar. Comuníquese con el nutricionista para obtener más información.     Esta información no tiene como fin reemplazar el consejo del médico. Asegúrese de hacerle al médico cualquier pregunta que tenga.     Document Released: 03/30/2012 Document Revised: 10/20/2014  Elsevier Interactive Patient Education ©2016 Elsevier Inc.

## 2015-08-04 LAB — URINE CULTURE

## 2015-08-07 ENCOUNTER — Encounter: Payer: Self-pay | Admitting: Nurse Practitioner

## 2015-08-07 NOTE — Progress Notes (Signed)
Subjective:  Presents for recheck on her thyroid and to discuss her recent lab work. Taking her medication every day. Also taking omega-3 daily. Her son is present today to help with interpretation. Complaints of odor to her urine for the past several days. No fever, dysuria, urgency or frequency. No pelvic or back pain.  Objective:   BP 124/80 mmHg  Ht 4' 10.5" (1.486 m)  Wt 139 lb (63.05 kg)  BMI 28.55 kg/m2 NAD. Alert, oriented. Lungs clear. No CVA or flank tenderness. Heart regular rate rhythm. Abdomen soft nondistended nontender. Thyroid nontender, no masses or goiter noted. Reviewed lab work with patient and her son. Labs dated 10/18 shows elevated TSH at 6.5; triglycerides are finally normal at 147 and LDL of 150. Slight elevation in AST at 39. Remainder of labs are unremarkable. Urine dipstick today shows large amount of leukocytes. Urine micro-negative.   Assessment:  Problem List Items Addressed This Visit      Endocrine   Hypothyroidism - Primary   Relevant Medications   levothyroxine (SYNTHROID, LEVOTHROID) 112 MCG tablet   Other Relevant Orders   TSH     Other   Mixed hyperlipidemia   Relevant Orders   Lipid panel    Other Visit Diagnoses    Abnormal urine odor        Relevant Orders    POCT urinalysis dipstick (Completed)    Urine culture (Completed)    Encounter for immunization          Plan:  Meds ordered this encounter  Medications  . levothyroxine (SYNTHROID, LEVOTHROID) 112 MCG tablet    Sig: Take 1 tablet (112 mcg total) by mouth daily.    Dispense:  30 tablet    Refill:  2    Order Specific Question:  Supervising Provider    Answer:  Merlyn AlbertLUKING, WILLIAM S [[id number]]  . sulfamethoxazole-trimethoprim (BACTRIM DS,SEPTRA DS) 800-160 MG tablet    Sig: Take 1 tablet by mouth 2 (two) times daily.    Dispense:  14 tablet    Refill:  0    Order Specific Question:  Supervising Provider    Answer:  Merlyn AlbertLUKING, WILLIAM S [[id number]]   Increase levothyroxine to 112 g daily.  Continue omega-3 since this has dramatically improved her triglycerides. Recommend low-fat, low-cholesterol diet and repeat TSH and lipid profiles in 3 months. Urine culture pending. Start Bactrim DS as directed because of upcoming weekend and abnormal urine dipstick. Warning signs reviewed. Call or go to ED over the weekend if worse. Patient and her son verbalized understanding. Return in about 6 months (around 01/31/2016) for physical.

## 2015-10-23 ENCOUNTER — Other Ambulatory Visit: Payer: Self-pay | Admitting: Nurse Practitioner

## 2015-11-06 LAB — LIPID PANEL
CHOL/HDL RATIO: 5.3 ratio — AB (ref 0.0–4.4)
CHOLESTEROL TOTAL: 207 mg/dL — AB (ref 100–199)
HDL: 39 mg/dL — AB (ref >39)
LDL Calculated: 137 mg/dL — ABNORMAL HIGH (ref 0–99)
TRIGLYCERIDES: 155 mg/dL — AB (ref 0–149)
VLDL Cholesterol Cal: 31 mg/dL (ref 5–40)

## 2015-11-06 LAB — TSH: TSH: 1.27 u[IU]/mL (ref 0.450–4.500)

## 2015-11-14 ENCOUNTER — Other Ambulatory Visit: Payer: Self-pay | Admitting: Nurse Practitioner

## 2015-11-14 DIAGNOSIS — E559 Vitamin D deficiency, unspecified: Secondary | ICD-10-CM | POA: Insufficient documentation

## 2015-11-14 MED ORDER — VITAMIN D (ERGOCALCIFEROL) 1.25 MG (50000 UNIT) PO CAPS: 50000.0000 [IU] | ORAL_CAPSULE | ORAL | Status: DC

## 2015-11-22 ENCOUNTER — Other Ambulatory Visit: Payer: Self-pay | Admitting: Nurse Practitioner

## 2015-11-24 ENCOUNTER — Ambulatory Visit (INDEPENDENT_AMBULATORY_CARE_PROVIDER_SITE_OTHER): Payer: BLUE CROSS/BLUE SHIELD | Admitting: Family Medicine

## 2015-11-24 VITALS — BP 120/80 | HR 76 | Temp 98.0°F | Resp 18 | Ht <= 58 in | Wt 140.2 lb

## 2015-11-24 DIAGNOSIS — M25559 Pain in unspecified hip: Secondary | ICD-10-CM

## 2015-11-24 DIAGNOSIS — R350 Frequency of micturition: Secondary | ICD-10-CM

## 2015-11-24 DIAGNOSIS — N390 Urinary tract infection, site not specified: Secondary | ICD-10-CM | POA: Diagnosis not present

## 2015-11-24 DIAGNOSIS — A499 Bacterial infection, unspecified: Secondary | ICD-10-CM | POA: Diagnosis not present

## 2015-11-24 LAB — POC MICROSCOPIC URINALYSIS (UMFC): Mucus: ABSENT

## 2015-11-24 LAB — POCT URINALYSIS DIP (MANUAL ENTRY)
Bilirubin, UA: NEGATIVE
Blood, UA: NEGATIVE
Glucose, UA: NEGATIVE
Ketones, POC UA: NEGATIVE
Nitrite, UA: POSITIVE — AB
Protein Ur, POC: NEGATIVE
Spec Grav, UA: 1.01
Urobilinogen, UA: 0.2
pH, UA: 6.5

## 2015-11-24 MED ORDER — CEPHALEXIN 500 MG PO CAPS: 500.0000 mg | ORAL_CAPSULE | Freq: Two times a day (BID) | ORAL | Status: DC

## 2015-11-24 NOTE — Patient Instructions (Signed)
  Infeccin urinaria  (Urinary Tract Infection)  La infeccin urinaria puede ocurrir en cualquier lugar del tracto urinario. El tracto urinario es un sistema de drenaje del cuerpo por el que se eliminan los desechos y el exceso de agua. El tracto urinario est formado por dos riones, dos urteres, la vejiga y la uretra. Los riones son rganos que tienen forma de frijol. Cada rin tiene aproximadamente el tamao del puo. Estn situados debajo de las costillas, uno a cada lado de la columna vertebral CAUSAS  La causa de la infeccin son los microbios, que son organismos microscpicos, que incluyen hongos, virus, y bacterias. Estos organismos son tan pequeos que slo pueden verse a travs del microscopio. Las bacterias son los microorganismos que ms comnmente causan infecciones urinarias.  SNTOMAS  Los sntomas pueden variar segn la edad y el sexo del paciente y por la ubicacin de la infeccin. Los sntomas en las mujeres jvenes incluyen la necesidad frecuente e intensa de orinar y una sensacin dolorosa de ardor en la vejiga o en la uretra durante la miccin. Las mujeres y los hombres mayores podrn sentir cansancio, temblores y debilidad y sentir dolores musculares y dolor abdominal. Si tiene fiebre, puede significar que la infeccin est en los riones. Otros sntomas son dolor en la espalda o en los lados debajo de las costillas, nuseas y vmitos.  DIAGNSTICO  Para diagnosticar una infeccin urinaria, el mdico le preguntar acerca de sus sntomas. Tambin le solicitar una muestra de orina. La muestra de orina se analiza para detectar bacterias y glbulos blancos de la sangre. Los glbulos blancos se forman en el organismo para ayudar a combatir las infecciones.  TRATAMIENTO  Por lo general, las infecciones urinarias pueden tratarse con medicamentos. Debido a que la mayora de las infecciones son causadas por bacterias, por lo general pueden tratarse con antibiticos. La eleccin del  antibitico y la duracin del tratamiento depender de sus sntomas y el tipo de bacteria causante de la infeccin.  INSTRUCCIONES PARA EL CUIDADO EN EL HOGAR   Si le recetaron antibiticos, tmelos exactamente como su mdico le indique. Termine el medicamento aunque se sienta mejor despus de haber tomado slo algunos.  Beba gran cantidad de lquido para mantener la orina de tono claro o color amarillo plido.  Evite la cafena, el t y las bebidas gaseosas. Estas sustancias irritan la vejiga.  Vaciar la vejiga con frecuencia. Evite retener la orina durante largos perodos.  Vace la vejiga antes y despus de tener relaciones sexuales.  Despus de mover el intestino, las mujeres deben higienizarse la regin perineal desde adelante hacia atrs. Use slo un papel tissue por vez. SOLICITE ATENCIN MDICA SI:   Siente dolor en la espalda.  Le sube la fiebre.  Los sntomas no mejoran luego de 3 das. SOLICITE ATENCIN MDICA DE INMEDIATO SI:   Siente dolor intenso en la espalda o en la zona inferior del abdomen.  Comienza a sentir escalofros.  Tiene nuseas o vmitos.  Tiene una sensacin continua de quemazn o molestias al orinar. ASEGRESE DE QUE:   Comprende estas instrucciones.  Controlar su enfermedad.  Solicitar ayuda de inmediato si no mejora o empeora.   Esta informacin no tiene como fin reemplazar el consejo del mdico. Asegrese de hacerle al mdico cualquier pregunta que tenga.   Document Released: 07/09/2005 Document Revised: 06/23/2012 Elsevier Interactive Patient Education 2016 Elsevier Inc.  

## 2015-11-24 NOTE — Progress Notes (Signed)
Chief Complaint:  Chief Complaint  Patient presents with  . Urinary Tract Infection  . Headache    HPI: Jamie Torres 60 y.o. female who reports to [facility] today complaining of urianry sxs , has low abd pain/pelvic pain with urinations, she has been tryign to drink a lot of fluids, took AZO today. No fevers ro chills. No nausea or vomiting. HAs ahd back pain. No blood in urine. The headache came on with urianry sxs. She has taken tylenol 2 . No light sensitivity, no vision cahnges, no nosies sentivitiseies. No chest pain or SOB. No confusion , no nausea vomiting.   Past Medical History  Diagnosis Date  . Arthritis   . Thyroid disease   . Hypertension   . Hypercholesteremia   . TB (pulmonary tuberculosis)   . Plantar fasciitis   . Hypothyroidism    Past Surgical History  Procedure Laterality Date  . Eye surgery    . Cancerous mole removed from eye    . Tubal ligation     Social History   Social History  . Marital Status: Married    Spouse Name: N/A  . Number of Children: N/A  . Years of Education: N/A   Social History Main Topics  . Smoking status: Never Smoker   . Smokeless tobacco: None  . Alcohol Use: No  . Drug Use: No  . Sexual Activity: Not Asked   Other Topics Concern  . None   Social History Narrative   No family history on file. No Known Allergies Prior to Admission medications   Medication Sig Start Date End Date Taking? Authorizing Provider  diclofenac (VOLTAREN) 75 MG EC tablet Take 1 tablet (75 mg total) by mouth 2 (two) times daily with a meal. Prn pain 10/25/15  Yes Campbell Riches, NP  fish oil-omega-3 fatty acids 1000 MG capsule Take 2 g by mouth daily.   Yes Historical Provider, MD  levothyroxine (SYNTHROID, LEVOTHROID) 112 MCG tablet take 1 tablet by mouth once daily 11/23/15  Yes Merlyn Albert, MD  Vitamin D, Ergocalciferol, (DRISDOL) 50000 units CAPS capsule Take 1 capsule (50,000 Units total) by mouth every 7 (seven) days.  11/14/15  Yes Campbell Riches, NP     ROS: The patient denies fevers, chills, night sweats, unintentional weight loss, chest pain, palpitations, wheezing, dyspnea on exertion, nausea, vomiting, abdominal pain,  hematuria, melena, numbness, weakness, or tingling.  All other systems have been reviewed and were otherwise negative with the exception of those mentioned in the HPI and as above.    PHYSICAL EXAM: Filed Vitals:   11/24/15 1610  BP: 120/80  Pulse: 76  Temp: 98 F (36.7 C)  Resp: 18   Body mass index is 29.31 kg/(m^2).   General: Alert, no acute distress HEENT:  Normocephalic, atraumatic, oropharynx patent. EOMI, PERRLA Op clear , TM normal , neck  Cardiovascular:  Regular rate and rhythm, no rubs murmurs or gallops.   Respiratory: Clear to auscultation bilaterally.  No wheezes, rales, or rhonchi.  No cyanosis, no use of accessory musculature Abdominal: No organomegaly, abdomen is soft and min -tender diffuse lower abd , positive bowel sounds. No masses. Skin: No rashes. Neurologic: Facial musculature symmetric. Psychiatric: Patient acts appropriately throughout our interaction. Lymphatic: No cervical or submandibular lymphadenopathy Musculoskeletal: Gait intact. No edema, tenderness Full ROM of neck  + CVA tenderness,   LABS: Results for orders placed or performed in visit on 11/24/15  POCT urinalysis dipstick  Result Value Ref Range   Color, UA yellow yellow   Clarity, UA clear clear   Glucose, UA negative negative   Bilirubin, UA negative negative   Ketones, POC UA negative negative   Spec Grav, UA 1.010    Blood, UA negative negative   pH, UA 6.5    Protein Ur, POC negative negative   Urobilinogen, UA 0.2    Nitrite, UA Positive (A) Negative   Leukocytes, UA small (1+) (A) Negative  POCT Microscopic Urinalysis ([facility])  Result Value Ref Range   WBC,UR,HPF,POC None None WBC/hpf   RBC,UR,HPF,POC None None RBC/hpf   Bacteria Few (A) None, Too numerous to  count   Mucus Absent Absent   Epithelial Cells, UR Per Microscopy Few (A) None, Too numerous to count cells/hpf     EKG/XRAY:   Primary read interpreted by Dr. Conley Rolls at [facility]   ASSESSMENT/PLAN: Encounter Diagnoses  Name Primary?  . Urinary frequency   . UTI (urinary tract infection), bacterial Yes  . Pain in joint, pelvic region and thigh, unspecified laterality    Push fluids Rx Keflex 500 mg bid, urine cx pending Fu prn   Gross sideeffects, risk and benefits, and alternatives of medications d/w patient. Patient is aware that all medications have potential sideeffects and we are unable to predict every sideeffect or drug-drug interaction that may occur.  Charmaine Placido DO  11/24/2015 5:02 PM

## 2015-11-26 LAB — URINE CULTURE: Colony Count: 70000

## 2015-11-29 ENCOUNTER — Telehealth: Payer: Self-pay | Admitting: Family Medicine

## 2015-11-29 NOTE — Telephone Encounter (Signed)
She is doing better , will cont for 3 more days for a total of 5-6 days  since improved on meds even though urine cx was inconclusive.

## 2016-01-14 ENCOUNTER — Other Ambulatory Visit: Payer: Self-pay | Admitting: Nurse Practitioner

## 2016-02-26 ENCOUNTER — Ambulatory Visit (INDEPENDENT_AMBULATORY_CARE_PROVIDER_SITE_OTHER): Payer: BLUE CROSS/BLUE SHIELD | Admitting: Nurse Practitioner

## 2016-02-26 ENCOUNTER — Encounter: Payer: Self-pay | Admitting: Nurse Practitioner

## 2016-02-26 VITALS — BP 106/74 | Ht <= 58 in | Wt 139.4 lb

## 2016-02-26 DIAGNOSIS — Z0000 Encounter for general adult medical examination without abnormal findings: Secondary | ICD-10-CM | POA: Diagnosis not present

## 2016-02-26 DIAGNOSIS — Z1151 Encounter for screening for human papillomavirus (HPV): Secondary | ICD-10-CM | POA: Diagnosis not present

## 2016-02-26 DIAGNOSIS — Z124 Encounter for screening for malignant neoplasm of cervix: Secondary | ICD-10-CM

## 2016-02-26 DIAGNOSIS — Z01419 Encounter for gynecological examination (general) (routine) without abnormal findings: Secondary | ICD-10-CM

## 2016-02-26 NOTE — Progress Notes (Signed)
   Subjective:    Patient ID: Jamie Torres, female    DOB: [birth year], 60 y.o.   MRN: [id number]  HPI presents for her wellness exam. Her daughter is present today as an interpreter. Regular dental and vision care. No vaginal bleeding. No pelvic pain. Very active lifestyle.    Review of Systems  Constitutional: Negative for fever, activity change, appetite change and fatigue.  HENT: Negative for dental problem, ear pain, sinus pressure and sore throat.   Respiratory: Negative for cough, chest tightness, shortness of breath and wheezing.   Cardiovascular: Negative for chest pain.  Gastrointestinal: Negative for abdominal pain, diarrhea, constipation and abdominal distention.  Genitourinary: Negative for dysuria, urgency, frequency, vaginal bleeding, vaginal discharge, enuresis, difficulty urinating, genital sores and pelvic pain.       Objective:   Physical Exam  Constitutional: She is oriented to person, place, and time. She appears well-developed. No distress.  HENT:  Right Ear: External ear normal.  Left Ear: External ear normal.  Mouth/Throat: Oropharynx is clear and moist.  Neck: Normal range of motion. Neck supple. No tracheal deviation present. No thyromegaly present.  Cardiovascular: Normal rate, regular rhythm and normal heart sounds.  Exam reveals no gallop.   No murmur heard. Pulmonary/Chest: Effort normal and breath sounds normal.  Abdominal: Soft. She exhibits no distension. There is no tenderness.  Genitourinary: Vagina normal and uterus normal. No vaginal discharge found.  External GU: no rashes or lesions. Vagina: no discharge. Cervix: one small pink polyp noted inside the os. No CMT. Bimanual exam: no tenderness or obvious masses. Rectal exam: no masses; no stool for hemoccult.   Musculoskeletal: She exhibits no edema.  Lymphadenopathy:    She has no cervical adenopathy.  Neurological: She is alert and oriented to person, place, and time.  Skin: Skin is warm and  dry. No rash noted.  Psychiatric: She has a normal mood and affect. Her behavior is normal.  Vitals reviewed. Breast exam: minimal nodularity; no masses; axillae no adenopathy.  Reviewed labs with patient from 11/05/15.      Assessment & Plan:  Well woman exam - Plan: Pap IG and HPV (high risk) DNA detection, POC Hemoccult Bld/Stl (3-Cd Home Screen)  Screening for cervical cancer - Plan: Pap IG and HPV (high risk) DNA detection  Screening for HPV (human papillomavirus) - Plan: Pap IG and HPV (high risk) DNA detection  Encouraged regular activity, healthy diet and daily vitamin D and calcium supplementation. Continue thyroid med at same dose. Return in about 1 year (around 02/25/2017) for physical.

## 2016-02-29 LAB — PAP IG AND HPV HIGH-RISK
HPV, HIGH-RISK: NEGATIVE
PAP SMEAR COMMENT: 0

## 2016-03-07 ENCOUNTER — Other Ambulatory Visit: Payer: Self-pay

## 2016-03-07 DIAGNOSIS — Z01419 Encounter for gynecological examination (general) (routine) without abnormal findings: Secondary | ICD-10-CM

## 2016-03-07 LAB — POC HEMOCCULT BLD/STL (HOME/3-CARD/SCREEN)
Card #3 Fecal Occult Blood, POC: NEGATIVE
FECAL OCCULT BLD: NEGATIVE
Fecal Occult Blood, POC: NEGATIVE

## 2016-04-06 ENCOUNTER — Other Ambulatory Visit: Payer: Self-pay | Admitting: Nurse Practitioner

## 2016-04-18 ENCOUNTER — Other Ambulatory Visit: Payer: Self-pay | Admitting: Family Medicine

## 2016-04-18 ENCOUNTER — Other Ambulatory Visit (HOSPITAL_COMMUNITY): Payer: Self-pay | Admitting: Hematology & Oncology

## 2016-04-18 DIAGNOSIS — Z1231 Encounter for screening mammogram for malignant neoplasm of breast: Secondary | ICD-10-CM

## 2016-04-28 ENCOUNTER — Ambulatory Visit (HOSPITAL_COMMUNITY)
Admission: RE | Admit: 2016-04-28 | Discharge: 2016-04-28 | Disposition: A | Discharge status: Home / Self Care | Payer: BLUE CROSS/BLUE SHIELD | Source: Ambulatory Visit | Attending: Family Medicine | Admitting: Family Medicine

## 2016-04-28 DIAGNOSIS — Z1231 Encounter for screening mammogram for malignant neoplasm of breast: Secondary | ICD-10-CM

## 2016-06-19 ENCOUNTER — Other Ambulatory Visit: Payer: Self-pay | Admitting: Family Medicine

## 2016-06-19 ENCOUNTER — Other Ambulatory Visit: Payer: Self-pay | Admitting: Nurse Practitioner

## 2016-06-19 NOTE — Telephone Encounter (Signed)
May have this +3 refills 

## 2016-07-05 ENCOUNTER — Other Ambulatory Visit: Payer: Self-pay | Admitting: Family Medicine

## 2016-09-10 ENCOUNTER — Other Ambulatory Visit: Payer: Self-pay | Admitting: Family Medicine

## 2016-12-03 ENCOUNTER — Other Ambulatory Visit: Payer: Self-pay | Admitting: Family Medicine

## 2016-12-04 NOTE — Telephone Encounter (Signed)
Patient may have one refill needs office visit 

## 2017-02-21 ENCOUNTER — Other Ambulatory Visit: Payer: Self-pay | Admitting: Family Medicine

## 2017-02-23 NOTE — Telephone Encounter (Signed)
Benign needs office visit

## 2017-03-17 ENCOUNTER — Encounter: Payer: Self-pay | Admitting: Nurse Practitioner

## 2017-03-17 ENCOUNTER — Ambulatory Visit (INDEPENDENT_AMBULATORY_CARE_PROVIDER_SITE_OTHER): Payer: BLUE CROSS/BLUE SHIELD | Admitting: Nurse Practitioner

## 2017-03-17 VITALS — BP 126/88 | Ht <= 58 in | Wt 145.0 lb

## 2017-03-17 DIAGNOSIS — E559 Vitamin D deficiency, unspecified: Secondary | ICD-10-CM

## 2017-03-17 DIAGNOSIS — Z79899 Other long term (current) drug therapy: Secondary | ICD-10-CM | POA: Diagnosis not present

## 2017-03-17 DIAGNOSIS — E782 Mixed hyperlipidemia: Secondary | ICD-10-CM

## 2017-03-17 DIAGNOSIS — M778 Other enthesopathies, not elsewhere classified: Secondary | ICD-10-CM

## 2017-03-17 DIAGNOSIS — M7581 Other shoulder lesions, right shoulder: Secondary | ICD-10-CM | POA: Diagnosis not present

## 2017-03-17 DIAGNOSIS — E039 Hypothyroidism, unspecified: Secondary | ICD-10-CM | POA: Diagnosis not present

## 2017-03-17 MED ORDER — DICLOFENAC SODIUM 75 MG PO TBEC: 75.0000 mg | DELAYED_RELEASE_TABLET | Freq: Two times a day (BID) | ORAL | 0 refills | Status: DC

## 2017-03-17 NOTE — Patient Instructions (Signed)
Ejercicios de amplitud de movimiento del hombro  (Shoulder Range of Motion Exercises)  Los ejercicios de amplitud de movimiento tienen como fin mantener la libertad de movimiento del hombro. Se recomiendan con frecuencia a las personas que tienen dolor en el hombro.  EJERCICIO DE MOVIMIENTO  Cuando pueda, haga este ejercicio de 5a 6das a la semana, o como se lo haya indicado el mdico. Vaya progresando hasta hacer 2 series de 10balanceos.  Ejercicio del pndulo  Cmo hacer este ejercicio recostado boca abajo  1. Recustese boca abajo sobre la cama con el abdomen cerca del borde de la cama.  2. Cuelgue el brazo sobre el borde de la cama.  3. Relaje el hombro, el brazo y la mano.  4. Balancee el brazo hacia adelante y hacia atrs en forma lenta y suave. No use los msculos del cuello para balancear el brazo. Deben estar relajados. Si tiene dificultades para balancear el brazo, pdale a alguien que lo haga por usted, con mucho cuidado. Cuando haga este ejercicio por primera vez, balancee el brazo en un ngulo de 15grados durante 15segundos o balancee el brazo 10veces. Con el tiempo, a medida que el dolor disminuya, aumente el ngulo del balanceo hasta llegar a 30 o 45grados.  5. Repita los pasos 1 a 4con el otro brazo.  Cmo hacer este ejercicio de pie  1. Prese junto a una mesa o una silla firme y apoye la mano encima.  1. Inclnese hacia adelante a la altura de la cintura.  2. Doble levemente las rodillas.  3. Relaje el otro brazo y deje que cuelgue flojo.  4. Relaje el omplato del brazo que cuelga y deje que caiga.  5. Siga manteniendo el hombro relajado y use el movimiento del cuerpo para balancear el brazo en pequeos crculos. La primera vez que haga este ejercicio, balancee el brazo durante 30segundos o 10veces. La prxima vez que lo haga, balancee el brazo durante un poco ms de tiempo.  6. Prese con la cabeza en alto y reljese.  7. Repita los pasos 1 a 7, pero esta vez cambie la direccin de los  crculos.  2. Repita los pasos 1 a 8 con el otro brazo.  EJERCICIOS DE ESTIRAMIENTO  Haga estos ejercicios de 3 a 4veces al da, de 5 a 6veces a la semana, o como se lo haya indicado el mdico. Vaya progresando hasta mantener el estiramiento durante 20segundos.  Ejercicio de estiramiento1  1. Levante el brazo en lnea recta hacia adelante.  2. Doble el brazo con un ngulo de 90grados a la altura del codo (ngulo recto) de modo tal que el antebrazo cruce por delante del cuerpo y se vea como la letra "L".  3. Use el otro brazo para tirar suavemente del codo hacia adelante y cruzar el cuerpo.  4. Repita los pasos 1 a 3 con el otro brazo.  Ejercicio de estiramiento2  Para este ejercicio, necesitar una toalla o una soga.  1. Doble un brazo por la espalda con la palma hacia afuera.  2. Con la otra mano, sostenga una toalla.  3. Levante el brazo que sostiene la toalla por encima de la cabeza y doble el brazo a la altura del codo. La mueca debe quedar detrs del cuello.  4. Con la mano libre, tome el extremo que cuelga de la toalla.  5. Con la mano que est ms arriba, tire suavemente de la toalla hacia arriba.  6. Con la mano que est ms abajo, tire de la   toalla hacia abajo.  7. Repita los pasos 1 a 6 con el otro brazo.  EJERCICIOS DE FORTALECIMIENTO  Haga cada uno de estos ejercicios en cuatro momentos diferentes del da (sesiones) todos los das o como se lo haya indicado el mdico. Para comenzar, repita cada ejercicio 5veces (repeticiones). Vaya progresando hasta hacer 3series de 12repeticiones o como se lo haya indicado el mdico.  Ejercicio de fortalecimiento1  Para esta actividad, necesitar una pesa liviana. A medida que tenga ms fuerza, podr usar una ms pesada.  1. Con una pesa en la mano, de pie, levante el brazo en lnea recta hacia el costado del cuerpo hasta que est a la misma altura que el hombro.  2. Doble el brazo a 90grados de modo tal que los dedos apunten hacia el techo.  3. Levante la  mano lentamente hasta que el brazo quede estirado hacia arriba.  4. Repita los pasos 1 a 3 con el otro brazo.  Ejercicio de fortalecimiento2  Para esta actividad, necesitar una pesa liviana. A medida que tenga ms fuerza, podr usar una ms pesada.  1. Con una pesa en la mano, de pie, estire el brazo y muvalo gradualmente hasta formar un arco, primero hacia el costado, luego hacia el frente y despus por encima de la cabeza.  2. Mueva gradualmente el otro brazo hasta formar un arco, primero hacia el costado, luego hacia el frente y despus por encima de la cabeza.  3. Repita los pasos 1 a 2 con el otro brazo.  Ejercicio de fortalecimiento3  Para esta actividad, necesitar una banda elstica. A medida que tenga ms fuerza, aumente gradualmente el tamao de las bandas o la cantidad de bandas que usa a la vez.  1. De pie, sostenga una banda elstica con una mano y levante ese brazo en lnea recta hacia arriba.  2. Con la otra mano, tire la banda hacia abajo hasta que esa mano le quede al costado del cuerpo.  3. Repita los pasos 1 a 2 con el otro brazo.  Esta informacin no tiene como fin reemplazar el consejo del mdico. Asegrese de hacerle al mdico cualquier pregunta que tenga.  Document Released: 07/20/2013 Document Revised: 02/13/2015 Document Reviewed: 09/25/2014  Elsevier Interactive Patient Education  2018 [employer]

## 2017-03-19 ENCOUNTER — Encounter: Payer: Self-pay | Admitting: Nurse Practitioner

## 2017-03-19 NOTE — Progress Notes (Signed)
Subjective:  Presents for recheck on her hypothyroidism. Compliant with medication. Husband is present today is interpreter. Is due for her routine lab work. Also complaints of right shoulder joint line pain for the past few days, relates to repetitive movement while washing dishes. No numbness or weakness of the arm.  Objective:   BP 126/88   Ht 4\' 10"  (1.473 m)   Wt 145 lb (65.8 kg)   BMI 30.31 kg/m  NAD. Alert, oriented. Lungs clear. Heart regular rate rhythm. Thyroid nontender, no masses noted. Tenderness noted along the anterior and mid shoulder joint line to palpation. Can perform full range of motion with minimal tenderness. Hand strength 5+ bilateral. Sensation grossly intact.  Assessment:   Problem List Items Addressed This Visit      Endocrine   Hypothyroidism - Primary   Relevant Orders   TSH     Other   Mixed hyperlipidemia   Relevant Orders   Lipid panel   Vitamin D deficiency   Relevant Orders   VITAMIN D 25 Hydroxy (Vit-D Deficiency, Fractures)    Other Visit Diagnoses    Right shoulder tendinitis       Relevant Medications   diclofenac (VOLTAREN) 75 MG EC tablet   High risk medication use       Relevant Orders   Basic metabolic panel   Hepatic function panel       Plan:   Meds ordered this encounter  Medications  . diclofenac (VOLTAREN) 75 MG EC tablet    Sig: Take 1 tablet (75 mg total) by mouth 2 (two) times daily with a meal.    Dispense:  60 tablet    Refill:  0    Order Specific Question:   Supervising Provider    Answer:   Merlyn AlbertLUKING, WILLIAM S [[id number]]   Restart diclofenac as directed. This was helping her pain until she ran out of medication. Given copy of shoulder exercises. Continue current meds. Lab work pending. Recommend preventive health physical.

## 2017-03-24 LAB — BASIC METABOLIC PANEL
BUN/Creatinine Ratio: 25 (ref 12–28)
BUN: 16 mg/dL (ref 8–27)
CHLORIDE: 105 mmol/L (ref 96–106)
CO2: 22 mmol/L (ref 20–29)
CREATININE: 0.65 mg/dL (ref 0.57–1.00)
Calcium: 8.8 mg/dL (ref 8.7–10.3)
GFR calc Af Amer: 110 mL/min/{1.73_m2} (ref >59)
GFR calc non Af Amer: 95 mL/min/{1.73_m2} (ref >59)
Glucose: 96 mg/dL (ref 65–99)
POTASSIUM: 4.2 mmol/L (ref 3.5–5.2)
SODIUM: 140 mmol/L (ref 134–144)

## 2017-03-24 LAB — TSH: TSH: 5.57 u[IU]/mL — ABNORMAL HIGH (ref 0.450–4.500)

## 2017-03-24 LAB — LIPID PANEL
Chol/HDL Ratio: 5.2 ratio — ABNORMAL HIGH (ref 0.0–4.4)
Cholesterol, Total: 197 mg/dL (ref 100–199)
HDL: 38 mg/dL — ABNORMAL LOW (ref >39)
LDL Calculated: 123 mg/dL — ABNORMAL HIGH (ref 0–99)
TRIGLYCERIDES: 180 mg/dL — AB (ref 0–149)
VLDL Cholesterol Cal: 36 mg/dL (ref 5–40)

## 2017-03-24 LAB — HEPATIC FUNCTION PANEL
ALBUMIN: 4.2 g/dL (ref 3.6–4.8)
ALK PHOS: 88 IU/L (ref 39–117)
ALT: 41 IU/L — AB (ref 0–32)
AST: 27 IU/L (ref 0–40)
BILIRUBIN TOTAL: 0.3 mg/dL (ref 0.0–1.2)
BILIRUBIN, DIRECT: 0.08 mg/dL (ref 0.00–0.40)
Total Protein: 7.2 g/dL (ref 6.0–8.5)

## 2017-03-24 LAB — VITAMIN D 25 HYDROXY (VIT D DEFICIENCY, FRACTURES): Vit D, 25-Hydroxy: 26 ng/mL — ABNORMAL LOW (ref 30.0–100.0)

## 2017-04-01 ENCOUNTER — Other Ambulatory Visit: Payer: Self-pay | Admitting: Nurse Practitioner

## 2017-04-01 MED ORDER — LEVOTHYROXINE SODIUM 125 MCG PO TABS: 125.0000 ug | ORAL_TABLET | Freq: Every day | ORAL | 2 refills | Status: DC

## 2017-04-06 ENCOUNTER — Other Ambulatory Visit: Payer: Self-pay | Admitting: Nurse Practitioner

## 2017-04-06 DIAGNOSIS — Z1231 Encounter for screening mammogram for malignant neoplasm of breast: Secondary | ICD-10-CM

## 2017-05-01 ENCOUNTER — Ambulatory Visit (HOSPITAL_COMMUNITY)
Admission: RE | Admit: 2017-05-01 | Discharge: 2017-05-01 | Disposition: A | Discharge status: Home / Self Care | Payer: BLUE CROSS/BLUE SHIELD | Source: Ambulatory Visit | Attending: Nurse Practitioner | Admitting: Nurse Practitioner

## 2017-05-01 DIAGNOSIS — R928 Other abnormal and inconclusive findings on diagnostic imaging of breast: Secondary | ICD-10-CM | POA: Insufficient documentation

## 2017-05-01 DIAGNOSIS — Z1231 Encounter for screening mammogram for malignant neoplasm of breast: Secondary | ICD-10-CM | POA: Diagnosis not present

## 2017-05-01 DIAGNOSIS — N6489 Other specified disorders of breast: Secondary | ICD-10-CM | POA: Diagnosis not present

## 2017-05-04 ENCOUNTER — Other Ambulatory Visit: Payer: Self-pay | Admitting: Nurse Practitioner

## 2017-05-04 DIAGNOSIS — R928 Other abnormal and inconclusive findings on diagnostic imaging of breast: Secondary | ICD-10-CM

## 2017-05-12 ENCOUNTER — Ambulatory Visit (HOSPITAL_COMMUNITY)
Admission: RE | Admit: 2017-05-12 | Discharge: 2017-05-12 | Disposition: A | Discharge status: Home / Self Care | Payer: BLUE CROSS/BLUE SHIELD | Source: Ambulatory Visit | Attending: Nurse Practitioner | Admitting: Nurse Practitioner

## 2017-05-12 DIAGNOSIS — R928 Other abnormal and inconclusive findings on diagnostic imaging of breast: Secondary | ICD-10-CM | POA: Diagnosis not present

## 2017-05-23 ENCOUNTER — Other Ambulatory Visit: Payer: Self-pay | Admitting: Nurse Practitioner

## 2017-07-26 ENCOUNTER — Other Ambulatory Visit: Payer: Self-pay | Admitting: Nurse Practitioner

## 2017-08-06 ENCOUNTER — Other Ambulatory Visit: Payer: Self-pay | Admitting: Nurse Practitioner

## 2017-10-21 ENCOUNTER — Other Ambulatory Visit: Payer: Self-pay | Admitting: Nurse Practitioner

## 2017-11-16 ENCOUNTER — Other Ambulatory Visit: Payer: Self-pay | Admitting: Family Medicine

## 2017-12-21 ENCOUNTER — Other Ambulatory Visit: Payer: Self-pay | Admitting: Family Medicine

## 2017-12-21 NOTE — Telephone Encounter (Signed)
Please connect with the patient-she may have a 30-day supply.  She needs to do lab work including lipid, liver, metabolic 7, vitamin D, TSH, she needs a follow-up visit with Eber Jonesarolyn

## 2017-12-22 ENCOUNTER — Other Ambulatory Visit: Payer: Self-pay

## 2017-12-22 ENCOUNTER — Telehealth: Payer: Self-pay

## 2017-12-22 DIAGNOSIS — Z13220 Encounter for screening for lipoid disorders: Secondary | ICD-10-CM

## 2017-12-22 DIAGNOSIS — R5383 Other fatigue: Secondary | ICD-10-CM

## 2017-12-22 DIAGNOSIS — Z79899 Other long term (current) drug therapy: Secondary | ICD-10-CM

## 2017-12-22 NOTE — Telephone Encounter (Signed)
Patient aware we gave her 30 day supply of medication and it was sent to the pharmacy. She is aware she needs blood work,and a follow up appt with Eber Jonesarolyn.She states she will have her daughter to call her to make the appt.

## 2017-12-24 ENCOUNTER — Other Ambulatory Visit: Payer: Self-pay | Admitting: Family Medicine

## 2018-01-12 ENCOUNTER — Encounter: Payer: Self-pay | Admitting: Nurse Practitioner

## 2018-01-12 ENCOUNTER — Ambulatory Visit: Payer: BLUE CROSS/BLUE SHIELD | Admitting: Nurse Practitioner

## 2018-01-12 VITALS — BP 110/68 | Ht <= 58 in | Wt 144.0 lb

## 2018-01-12 DIAGNOSIS — E039 Hypothyroidism, unspecified: Secondary | ICD-10-CM

## 2018-01-13 ENCOUNTER — Encounter: Payer: Self-pay | Admitting: Nurse Practitioner

## 2018-01-13 NOTE — Progress Notes (Signed)
Subjective:  Presents with her daughter for recheck on her thyroid. Adherent to medication. Has not done her labs at this time. Daughter is serving as Equities traderinterpreter. No vaginal bleeding or pelvic pain. No CP/ischemic type pain or SOB. No edema. No difficulty swallowing.   Objective:   BP 110/68   Ht 4\' 10"  (1.473 m)   Wt 144 lb (65.3 kg)   BMI 30.10 kg/m  NAD. Alert, oriented. Thyroid non tender to palpation. No mass or goiter noted. Lungs clear. Heart RRR. LE: no edema.  Assessment:   Problem List Items Addressed This Visit      Endocrine   Hypothyroidism - Primary       Plan:    Continue medications at current doses. Recommend getting labs done.  Return for physical within the next few months.

## 2018-01-15 DIAGNOSIS — Z13220 Encounter for screening for lipoid disorders: Secondary | ICD-10-CM | POA: Diagnosis not present

## 2018-01-15 DIAGNOSIS — R5383 Other fatigue: Secondary | ICD-10-CM | POA: Diagnosis not present

## 2018-01-15 DIAGNOSIS — Z79899 Other long term (current) drug therapy: Secondary | ICD-10-CM | POA: Diagnosis not present

## 2018-01-16 LAB — BASIC METABOLIC PANEL
BUN / CREAT RATIO: 23 (ref 12–28)
BUN: 14 mg/dL (ref 8–27)
CALCIUM: 9.2 mg/dL (ref 8.7–10.3)
CHLORIDE: 106 mmol/L (ref 96–106)
CO2: 19 mmol/L — AB (ref 20–29)
Creatinine, Ser: 0.61 mg/dL (ref 0.57–1.00)
GFR calc Af Amer: 112 mL/min/{1.73_m2} (ref >59)
GFR calc non Af Amer: 97 mL/min/{1.73_m2} (ref >59)
GLUCOSE: 98 mg/dL (ref 65–99)
Potassium: 4.2 mmol/L (ref 3.5–5.2)
Sodium: 142 mmol/L (ref 134–144)

## 2018-01-16 LAB — LIPID PANEL
CHOL/HDL RATIO: 5.6 ratio — AB (ref 0.0–4.4)
Cholesterol, Total: 200 mg/dL — ABNORMAL HIGH (ref 100–199)
HDL: 36 mg/dL — AB (ref >39)
LDL CALC: 129 mg/dL — AB (ref 0–99)
Triglycerides: 173 mg/dL — ABNORMAL HIGH (ref 0–149)
VLDL CHOLESTEROL CAL: 35 mg/dL (ref 5–40)

## 2018-01-16 LAB — HEPATIC FUNCTION PANEL
ALT: 46 IU/L — ABNORMAL HIGH (ref 0–32)
AST: 28 IU/L (ref 0–40)
Albumin: 4.2 g/dL (ref 3.6–4.8)
Alkaline Phosphatase: 114 IU/L (ref 39–117)
BILIRUBIN, DIRECT: 0.1 mg/dL (ref 0.00–0.40)
Bilirubin Total: 0.3 mg/dL (ref 0.0–1.2)
TOTAL PROTEIN: 7.2 g/dL (ref 6.0–8.5)

## 2018-01-16 LAB — VITAMIN D 25 HYDROXY (VIT D DEFICIENCY, FRACTURES): VIT D 25 HYDROXY: 22.6 ng/mL — AB (ref 30.0–100.0)

## 2018-01-16 LAB — TSH: TSH: 0.121 u[IU]/mL — AB (ref 0.450–4.500)

## 2018-01-20 ENCOUNTER — Other Ambulatory Visit: Payer: Self-pay | Admitting: Family Medicine

## 2018-02-18 ENCOUNTER — Encounter: Payer: Self-pay | Admitting: Nurse Practitioner

## 2018-02-18 ENCOUNTER — Ambulatory Visit (INDEPENDENT_AMBULATORY_CARE_PROVIDER_SITE_OTHER): Payer: BLUE CROSS/BLUE SHIELD | Admitting: Nurse Practitioner

## 2018-02-18 VITALS — BP 120/80 | Ht <= 58 in | Wt 145.0 lb

## 2018-02-18 DIAGNOSIS — Z0000 Encounter for general adult medical examination without abnormal findings: Secondary | ICD-10-CM

## 2018-02-18 DIAGNOSIS — R748 Abnormal levels of other serum enzymes: Secondary | ICD-10-CM

## 2018-02-18 DIAGNOSIS — E039 Hypothyroidism, unspecified: Secondary | ICD-10-CM

## 2018-02-18 DIAGNOSIS — E559 Vitamin D deficiency, unspecified: Secondary | ICD-10-CM

## 2018-02-18 MED ORDER — VITAMIN D (ERGOCALCIFEROL) 1.25 MG (50000 UNIT) PO CAPS: 50000.0000 [IU] | ORAL_CAPSULE | ORAL | 2 refills | Status: DC

## 2018-02-18 NOTE — Patient Instructions (Signed)
Take one pill of thyroid medicine Monday - Friday with 1/2 tab on Saturday and Sunday. Repeat labs in 3 months.

## 2018-02-20 ENCOUNTER — Encounter: Payer: Self-pay | Admitting: Nurse Practitioner

## 2018-02-20 NOTE — Progress Notes (Signed)
Subjective:    Patient ID: Jamie Torres, female    DOB: [birth year], 62 y.o.   MRN: [id number]  HPI presents for her wellness exam.  Her daughter is present per her request to act as an interpreter.  Married, same sexual partner.  No vaginal bleeding or pelvic pain.  Regular vision and dental exams.  Some activity, working at a [employer] 2 to 3 days/week.  Is not currently taking a vitamin D supplement.    Review of Systems  Constitutional: Negative for activity change, appetite change and fatigue.  HENT: Negative for dental problem, ear pain, sinus pressure and sore throat.   Respiratory: Negative for cough, chest tightness, shortness of breath and wheezing.   Cardiovascular: Negative for chest pain.  Gastrointestinal: Negative for abdominal distention, abdominal pain, blood in stool, constipation, diarrhea, nausea and vomiting.  Genitourinary: Negative for difficulty urinating, dysuria, enuresis, frequency, genital sores, pelvic pain, urgency, vaginal bleeding and vaginal discharge.       Objective:   Physical Exam  Constitutional: She is oriented to person, place, and time. She appears well-developed. No distress.  HENT:  Right Ear: External ear normal.  Left Ear: External ear normal.  Mouth/Throat: Oropharynx is clear and moist.  Neck: Normal range of motion. Neck supple. No tracheal deviation present. No thyromegaly present.  Cardiovascular: Normal rate, regular rhythm and normal heart sounds. Exam reveals no gallop.  No murmur heard. Pulmonary/Chest: Effort normal and breath sounds normal. Right breast exhibits no inverted nipple, no mass, no skin change and no tenderness. Left breast exhibits no inverted nipple, no mass, no skin change and no tenderness. Breasts are symmetrical.  Axillae no adenopathy.  Abdominal: Soft. She exhibits no distension. There is no tenderness.  Genitourinary: Vagina normal and uterus normal. No vaginal discharge found.  Genitourinary  Comments: External GU no rashes or lesions.  Vagina no discharge.  Cervix normal in appearance.  Bimanual exam no tenderness or obvious masses.  Musculoskeletal: She exhibits no edema.  Lymphadenopathy:    She has no cervical adenopathy.  Neurological: She is alert and oriented to person, place, and time.  Skin: Skin is warm and dry. No rash noted.  Psychiatric: She has a normal mood and affect. Her behavior is normal.   Results for orders placed or performed in visit on 12/22/17  Lipid panel  Result Value Ref Range   Cholesterol, Total 200 (H) 100 - 199 mg/dL   Triglycerides 409 (H) 0 - 149 mg/dL   HDL 36 (L) >81 mg/dL   VLDL Cholesterol Cal 35 5 - 40 mg/dL   LDL Calculated 191 (H) 0 - 99 mg/dL   Chol/HDL Ratio 5.6 (H) 0.0 - 4.4 ratio  Hepatic function panel  Result Value Ref Range   Total Protein 7.2 6.0 - 8.5 g/dL   Albumin 4.2 3.6 - 4.8 g/dL   Bilirubin Total 0.3 0.0 - 1.2 mg/dL   Bilirubin, Direct 4.78 0.00 - 0.40 mg/dL   Alkaline Phosphatase 114 39 - 117 IU/L   AST 28 0 - 40 IU/L   ALT 46 (H) 0 - 32 IU/L  Basic metabolic panel  Result Value Ref Range   Glucose 98 65 - 99 mg/dL   BUN 14 8 - 27 mg/dL   Creatinine, Ser 2.95 0.57 - 1.00 mg/dL   GFR calc non Af Amer 97 >59 mL/min/1.73   GFR calc Af Amer 112 >59 mL/min/1.73   BUN/Creatinine Ratio 23 12 - 28   Sodium 142 134 -  144 mmol/L   Potassium 4.2 3.5 - 5.2 mmol/L   Chloride 106 96 - 106 mmol/L   CO2 19 (L) 20 - 29 mmol/L   Calcium 9.2 8.7 - 10.3 mg/dL  VITAMIN D 25 Hydroxy (Vit-D Deficiency, Fractures)  Result Value Ref Range   Vit D, 25-Hydroxy 22.6 (L) 30.0 - 100.0 ng/mL  TSH  Result Value Ref Range   TSH 0.121 (L) 0.450 - 4.500 uIU/mL   Reviewed labs with patient.       Assessment & Plan:   Problem List Items Addressed This Visit      Endocrine   Hypothyroidism   Relevant Orders   TSH     Other   Vitamin D deficiency   Relevant Orders   DG Bone Density    Other Visit Diagnoses    Annual  physical exam    -  Primary   Relevant Orders   DG Bone Density   Elevated liver enzymes       Relevant Orders   Hepatic function panel     Meds ordered this encounter  Medications  . Vitamin D, Ergocalciferol, (DRISDOL) 50000 units CAPS capsule    Sig: Take 1 capsule (50,000 Units total) by mouth every 7 (seven) days.    Dispense:  4 capsule    Refill:  2    Order Specific Question:   Supervising Provider    Answer:   Merlyn Albert [[id number]]   Once vitamin D prescription is complete start OTC vitamin D as directed. Take 1 pill of thyroid medicine Monday through Friday with a half tab on Saturday and Sunday.  Slight elevation in 1 of her liver enzymes.  Repeat hepatic function and TSH in 3 months.  Discussed lipid profile.  Some improvement has been noted.  Increase activity.  Patient recently got back from [place], did not do well with her diet while she was gone.  Reduce saturated fats in her diet.  Hold on any medication at this point. Return in about 1 year (around 02/19/2019) for physical.

## 2018-02-24 ENCOUNTER — Other Ambulatory Visit (HOSPITAL_COMMUNITY): Payer: BLUE CROSS/BLUE SHIELD

## 2018-03-30 ENCOUNTER — Other Ambulatory Visit: Payer: Self-pay | Admitting: Family Medicine

## 2018-04-06 DIAGNOSIS — R748 Abnormal levels of other serum enzymes: Secondary | ICD-10-CM | POA: Diagnosis not present

## 2018-04-06 DIAGNOSIS — E039 Hypothyroidism, unspecified: Secondary | ICD-10-CM | POA: Diagnosis not present

## 2018-04-07 LAB — HEPATIC FUNCTION PANEL
ALT: 40 IU/L — ABNORMAL HIGH (ref 0–32)
AST: 26 IU/L (ref 0–40)
Albumin: 4.1 g/dL (ref 3.6–4.8)
Alkaline Phosphatase: 101 IU/L (ref 39–117)
BILIRUBIN TOTAL: 0.3 mg/dL (ref 0.0–1.2)
BILIRUBIN, DIRECT: 0.09 mg/dL (ref 0.00–0.40)
TOTAL PROTEIN: 7.2 g/dL (ref 6.0–8.5)

## 2018-04-07 LAB — TSH: TSH: 0.095 u[IU]/mL — AB (ref 0.450–4.500)

## 2018-04-16 ENCOUNTER — Other Ambulatory Visit: Payer: Self-pay | Admitting: Nurse Practitioner

## 2018-04-16 MED ORDER — LEVOTHYROXINE SODIUM 112 MCG PO TABS: 112.0000 ug | ORAL_TABLET | Freq: Every day | ORAL | 0 refills | Status: DC

## 2018-04-20 ENCOUNTER — Other Ambulatory Visit: Payer: Self-pay | Admitting: Family Medicine

## 2018-04-20 DIAGNOSIS — Z1231 Encounter for screening mammogram for malignant neoplasm of breast: Secondary | ICD-10-CM

## 2018-04-22 ENCOUNTER — Other Ambulatory Visit: Payer: Self-pay | Admitting: *Deleted

## 2018-04-22 ENCOUNTER — Telehealth: Payer: Self-pay | Admitting: Family Medicine

## 2018-04-22 DIAGNOSIS — E039 Hypothyroidism, unspecified: Secondary | ICD-10-CM

## 2018-04-22 NOTE — Telephone Encounter (Signed)
Pt's daughter calling to receive lab results for pt. She is not on DPR and pt doesn't speak english. I told her we would contact the pt with an interpreter on the line to translate for us. CB# [phone]. She said the best time to call her would be this afternoon.

## 2018-04-22 NOTE — Telephone Encounter (Signed)
Left message to return call 

## 2018-04-22 NOTE — Telephone Encounter (Signed)
See result note.  

## 2018-04-27 ENCOUNTER — Ambulatory Visit: Payer: BLUE CROSS/BLUE SHIELD | Admitting: Family Medicine

## 2018-04-27 ENCOUNTER — Encounter: Payer: Self-pay | Admitting: Family Medicine

## 2018-04-27 VITALS — BP 128/86 | Temp 98.4°F | Ht <= 58 in | Wt 143.0 lb

## 2018-04-27 DIAGNOSIS — R350 Frequency of micturition: Secondary | ICD-10-CM | POA: Diagnosis not present

## 2018-04-27 DIAGNOSIS — N8111 Cystocele, midline: Secondary | ICD-10-CM | POA: Diagnosis not present

## 2018-04-27 DIAGNOSIS — N390 Urinary tract infection, site not specified: Secondary | ICD-10-CM

## 2018-04-27 LAB — POCT URINALYSIS DIPSTICK
Spec Grav, UA: 1.01 (ref 1.010–1.025)
pH, UA: 7 (ref 5.0–8.0)

## 2018-04-27 MED ORDER — NITROFURANTOIN MONOHYD MACRO 100 MG PO CAPS: 100.0000 mg | ORAL_CAPSULE | Freq: Two times a day (BID) | ORAL | 0 refills | Status: AC

## 2018-04-27 NOTE — Progress Notes (Signed)
   Subjective:    Patient ID: Jamie Torres, female    DOB: 08-31-1956, 62 y.o.   MRN: [id number]  Urinary Tract Infection   This is a new problem. The current episode started in the past 7 days (Satuday). Associated symptoms comments: Abdominal pain, painful urination, vaginal bleeding. Treatments tried: AZO.  Not bleeding when urinating. Has a cyst and its bleeding from that.   Noted some irritation to a cyst that she is had in the past   incr frequency with low abd discomfort   Patient presents with abdominal pain.  Low abdomen.  Some increased frequency.  Some dysuria.  Also notes mild amount of bleeding when wiping.  Uncertain whether it came from the urine or vaginal area.  She mentioned having "cyst" in her vaginal area   Results for orders placed or performed in visit on 04/27/18  POCT Urinalysis Dipstick  Result Value Ref Range   Color, UA     Clarity, UA     Glucose, UA  Negative   Bilirubin, UA +    Ketones, UA     Spec Grav, UA 1.010 1.010 - 1.025   Blood, UA     pH, UA 7.0 5.0 - 8.0   Protein, UA  Negative   Urobilinogen, UA  0.2 or 1.0 E.U./dL   Nitrite, UA     Leukocytes, UA  Negative   Appearance     Odor       Review of Systems No headache, no major weight loss or weight gain, no chest pain no back pain abdominal pain no change in bowel habits complete ROS otherwise negative     Objective:   Physical Exam Alert vitals stable, NAD. Blood pressure good on repeat. HEENT normal. Lungs clear. Heart regular rate and rhythm. No abdomen mild tenderness to palpation pelvic exam cystocele evident.  No bleeding source evident  Urinalysis numerous white blood cells per high-power field occasional red blood cell impression urinary tract infection with elements of hematuria.  Will cover with antibiotics.  Culture urine.  Symptom care discussed.  If bleeding were to persist call us.  Further recommendations based on culture.  Patient's adult daughter provided  interpretation services  Greater than 50% of this 25 minute face to face visit was spent in counseling and discussion and coordination of care regarding the above diagnosis/diagnosies        Assessment & Plan:

## 2018-04-29 LAB — SPECIMEN STATUS REPORT

## 2018-04-29 LAB — URINE CULTURE

## 2018-05-03 ENCOUNTER — Encounter (HOSPITAL_COMMUNITY): Payer: Self-pay

## 2018-05-03 ENCOUNTER — Ambulatory Visit (HOSPITAL_COMMUNITY)
Admission: RE | Admit: 2018-05-03 | Discharge: 2018-05-03 | Disposition: A | Discharge status: Home / Self Care | Payer: BLUE CROSS/BLUE SHIELD | Source: Ambulatory Visit | Attending: Family Medicine | Admitting: Family Medicine

## 2018-05-03 DIAGNOSIS — Z1231 Encounter for screening mammogram for malignant neoplasm of breast: Secondary | ICD-10-CM | POA: Diagnosis not present

## 2018-06-01 DIAGNOSIS — E039 Hypothyroidism, unspecified: Secondary | ICD-10-CM | POA: Diagnosis not present

## 2018-06-02 ENCOUNTER — Other Ambulatory Visit: Payer: Self-pay | Admitting: Family Medicine

## 2018-06-02 LAB — TSH: TSH: 0.742 u[IU]/mL (ref 0.450–4.500)

## 2018-06-02 MED ORDER — LEVOTHYROXINE SODIUM 112 MCG PO TABS: 112.0000 ug | ORAL_TABLET | Freq: Every day | ORAL | 1 refills | Status: DC

## 2018-06-29 ENCOUNTER — Encounter: Payer: Self-pay | Admitting: Family Medicine

## 2018-06-29 ENCOUNTER — Ambulatory Visit (INDEPENDENT_AMBULATORY_CARE_PROVIDER_SITE_OTHER): Payer: BLUE CROSS/BLUE SHIELD | Admitting: Family Medicine

## 2018-06-29 VITALS — BP 112/70 | Temp 98.2°F | Ht <= 58 in | Wt 141.8 lb

## 2018-06-29 DIAGNOSIS — R1313 Dysphagia, pharyngeal phase: Secondary | ICD-10-CM | POA: Diagnosis not present

## 2018-06-29 NOTE — Progress Notes (Signed)
   Subjective:    Patient ID: Jamie Torres, female    DOB: November 19, 1955, 62 y.o.   MRN: [id number]  HPIpt arrives with son Rod who is interpreting. Interpreting services offered and declined by pt.  Trouble swallowing solid foods for about one year but getting worse over the last month. Reports food getting "stuck" in throat, has to drink a lot of water to get it to go down. No N/V. No abdominal pain. Denies pain or sore throat. Reports discomfort to right side of throat on palpation and even without eating. Has no trouble swallowing liquids, and does not feel the same discomfort or difficulty that she does when swallowing solid foods.  Denies coughing up any blood.   No smoking or alcohol use.    Review of Systems  Constitutional: Negative for chills, fever and unexpected weight change.  HENT: Negative for congestion and postnasal drip.   Respiratory: Negative for cough and shortness of breath.   Cardiovascular: Negative for chest pain.  Gastrointestinal: Negative for abdominal pain, nausea and vomiting.  All other systems reviewed and are negative.      Objective:   Physical Exam  Constitutional: She is oriented to person, place, and time. She appears well-developed and well-nourished. No distress.  HENT:  Head: Normocephalic and atraumatic.  Nose: Nose normal.  Mouth/Throat: Oropharynx is clear and moist.  Eyes: Right eye exhibits no discharge. Left eye exhibits no discharge.  Neck: Neck supple. No thyromegaly present.  No masses noted on palpation.  Cardiovascular: Normal rate, regular rhythm and normal heart sounds.  Pulmonary/Chest: Effort normal and breath sounds normal. No respiratory distress.  Lymphadenopathy:    She has no cervical adenopathy.  Neurological: She is alert and oriented to person, place, and time.  Skin: Skin is warm and dry.  Psychiatric: She has a normal mood and affect.  Nursing note and vitals reviewed.      Assessment & Plan:  1. Pharyngeal  dysphagia  ENT referral placed for further evaluation of patient's dysphagia symptoms. If ENT evaluation is normal then we will refer to GI for an endoscopy.  Instructed patient and her son to let us know the result of her ENT visit so we are aware if a GI referral is needed, verbalized understanding. Warning signs discussed.   - Ambulatory referral to ENT  As attending physician to this patient visit, this patient was seen in conjunction with the nurse practitioner.  The history,physical and treatment plan was reviewed with the nurse practitioner and pertinent findings were verified with the patient.  Also the treatment plan was reviewed with the patient while they were present. SAL

## 2018-07-02 ENCOUNTER — Encounter: Payer: Self-pay | Admitting: Family Medicine

## 2018-07-08 ENCOUNTER — Other Ambulatory Visit: Payer: Self-pay | Admitting: Family Medicine

## 2018-07-08 DIAGNOSIS — K219 Gastro-esophageal reflux disease without esophagitis: Secondary | ICD-10-CM | POA: Diagnosis not present

## 2018-07-08 DIAGNOSIS — R1313 Dysphagia, pharyngeal phase: Secondary | ICD-10-CM | POA: Diagnosis not present

## 2018-07-26 ENCOUNTER — Encounter: Payer: Self-pay | Admitting: Family Medicine

## 2018-07-26 ENCOUNTER — Ambulatory Visit (INDEPENDENT_AMBULATORY_CARE_PROVIDER_SITE_OTHER): Payer: BLUE CROSS/BLUE SHIELD | Admitting: Family Medicine

## 2018-07-26 VITALS — BP 136/88 | HR 73 | Temp 98.4°F | Ht <= 58 in | Wt 146.0 lb

## 2018-07-26 DIAGNOSIS — J181 Lobar pneumonia, unspecified organism: Secondary | ICD-10-CM | POA: Diagnosis not present

## 2018-07-26 DIAGNOSIS — J189 Pneumonia, unspecified organism: Secondary | ICD-10-CM

## 2018-07-26 DIAGNOSIS — R0600 Dyspnea, unspecified: Secondary | ICD-10-CM | POA: Diagnosis not present

## 2018-07-26 MED ORDER — AZITHROMYCIN 250 MG PO TABS: ORAL_TABLET | ORAL | 0 refills | Status: DC

## 2018-07-26 MED ORDER — ALBUTEROL SULFATE HFA 108 (90 BASE) MCG/ACT IN AERS: 2.0000 | INHALATION_SPRAY | Freq: Four times a day (QID) | RESPIRATORY_TRACT | 2 refills | Status: DC | PRN

## 2018-07-26 NOTE — Progress Notes (Signed)
   Subjective:    Patient ID: Jamie Torres, female    DOB: August 06, 1956, 62 y.o.   MRN: [id number]  HPI Patient states she started last week having some sinus congestion and headacheand feeling weak having some shortness of breath.She has been taking advil, and day quil. She relates some chest congestion a little bit of headache not feeling well also relates some stuffiness and in addition to this felt some shortness of breath with activity denies any substernal chest tightness pressure pain no high fever chills sweats or vomiting  Review of Systems  Constitutional: Negative for activity change and fever.  HENT: Positive for congestion and rhinorrhea. Negative for ear pain.   Eyes: Negative for discharge.  Respiratory: Positive for cough. Negative for shortness of breath and wheezing.   Cardiovascular: Negative for chest pain.  Occasional cough not severe     Objective:   Physical Exam Eardrums are normal throat is normal no masses neck no masses warm dry skin lungs clear on the left side but some crackles on the right side heart regular no murmurs       Assessment & Plan:  The patient overall should do well This easily could be early pneumonia recommend antibiotics follow-up if progressive troubles or worse chest x-ray ordered await the results of this albuterol given just in case but currently does not need to get this filled  Warning signs discussed if high fever progressive troubles or worse follow-up

## 2018-07-27 ENCOUNTER — Ambulatory Visit (HOSPITAL_COMMUNITY)
Admission: RE | Admit: 2018-07-27 | Discharge: 2018-07-27 | Disposition: A | Discharge status: Home / Self Care | Payer: BLUE CROSS/BLUE SHIELD | Source: Ambulatory Visit | Attending: Family Medicine | Admitting: Family Medicine

## 2018-07-27 DIAGNOSIS — J181 Lobar pneumonia, unspecified organism: Secondary | ICD-10-CM | POA: Diagnosis not present

## 2018-07-27 DIAGNOSIS — J189 Pneumonia, unspecified organism: Secondary | ICD-10-CM

## 2018-07-27 DIAGNOSIS — R0600 Dyspnea, unspecified: Secondary | ICD-10-CM

## 2018-12-11 DIAGNOSIS — H40033 Anatomical narrow angle, bilateral: Secondary | ICD-10-CM | POA: Diagnosis not present

## 2018-12-11 DIAGNOSIS — H2513 Age-related nuclear cataract, bilateral: Secondary | ICD-10-CM | POA: Diagnosis not present

## 2019-01-31 ENCOUNTER — Other Ambulatory Visit: Payer: Self-pay | Admitting: Family Medicine

## 2019-02-25 ENCOUNTER — Other Ambulatory Visit: Payer: Self-pay | Admitting: Family Medicine

## 2019-02-26 NOTE — Telephone Encounter (Signed)
May have 90-day refill  Have a phone call patient to schedule her office visit for this summer with [facility]

## 2019-03-02 ENCOUNTER — Telehealth: Payer: Self-pay | Admitting: *Deleted

## 2019-03-02 NOTE — Telephone Encounter (Signed)
Per dr Lorin Picket. Call pt and schedule her a physical with carolyn this summer.

## 2019-04-18 ENCOUNTER — Other Ambulatory Visit: Payer: BLUE CROSS/BLUE SHIELD

## 2019-04-18 ENCOUNTER — Other Ambulatory Visit: Payer: Self-pay | Admitting: Internal Medicine

## 2019-04-18 DIAGNOSIS — Z20822 Contact with and (suspected) exposure to covid-19: Secondary | ICD-10-CM

## 2019-04-20 ENCOUNTER — Other Ambulatory Visit (HOSPITAL_COMMUNITY): Payer: Self-pay | Admitting: Family Medicine

## 2019-04-20 DIAGNOSIS — Z1231 Encounter for screening mammogram for malignant neoplasm of breast: Secondary | ICD-10-CM

## 2019-04-23 LAB — NOVEL CORONAVIRUS, NAA: SARS-CoV-2, NAA: NOT DETECTED

## 2019-04-25 ENCOUNTER — Encounter: Payer: BLUE CROSS/BLUE SHIELD | Admitting: Nurse Practitioner

## 2019-04-27 NOTE — Telephone Encounter (Signed)
Has physical on 7/29

## 2019-05-04 ENCOUNTER — Other Ambulatory Visit: Payer: BC Managed Care – PPO

## 2019-05-04 ENCOUNTER — Other Ambulatory Visit: Payer: Self-pay

## 2019-05-04 DIAGNOSIS — Z20822 Contact with and (suspected) exposure to covid-19: Secondary | ICD-10-CM

## 2019-05-04 DIAGNOSIS — R6889 Other general symptoms and signs: Secondary | ICD-10-CM | POA: Diagnosis not present

## 2019-05-06 ENCOUNTER — Ambulatory Visit (HOSPITAL_COMMUNITY): Payer: BC Managed Care – PPO

## 2019-05-07 LAB — NOVEL CORONAVIRUS, NAA: SARS-CoV-2, NAA: NOT DETECTED

## 2019-05-10 ENCOUNTER — Other Ambulatory Visit: Payer: Self-pay

## 2019-05-11 ENCOUNTER — Other Ambulatory Visit: Payer: Self-pay

## 2019-05-11 ENCOUNTER — Ambulatory Visit (INDEPENDENT_AMBULATORY_CARE_PROVIDER_SITE_OTHER): Payer: BC Managed Care – PPO | Admitting: Family Medicine

## 2019-05-11 ENCOUNTER — Other Ambulatory Visit: Payer: Self-pay | Admitting: Family Medicine

## 2019-05-11 ENCOUNTER — Encounter: Payer: Self-pay | Admitting: Family Medicine

## 2019-05-11 VITALS — BP 138/88 | Temp 97.4°F | Ht <= 58 in | Wt 149.0 lb

## 2019-05-11 DIAGNOSIS — E039 Hypothyroidism, unspecified: Secondary | ICD-10-CM

## 2019-05-11 DIAGNOSIS — R030 Elevated blood-pressure reading, without diagnosis of hypertension: Secondary | ICD-10-CM | POA: Diagnosis not present

## 2019-05-11 DIAGNOSIS — Z13220 Encounter for screening for lipoid disorders: Secondary | ICD-10-CM

## 2019-05-11 DIAGNOSIS — E559 Vitamin D deficiency, unspecified: Secondary | ICD-10-CM

## 2019-05-11 MED ORDER — LEVOTHYROXINE SODIUM 112 MCG PO TABS: ORAL_TABLET | ORAL | 1 refills | Status: DC

## 2019-05-11 NOTE — Patient Instructions (Signed)
Plan de alimentacin DASH DASH Eating Plan DASH es la sigla en ingls de "Enfoques Alimentarios para Detener la Hipertensin" (Dietary Approaches to Stop Hypertension). El plan de alimentacin DASH ha demostrado bajar la presin arterial elevada (hipertensin). Tambin puede reducir el riesgo de diabetes tipo 2, enfermedad cardaca y accidente cerebrovascular. Este plan tambin puede ayudar a adelgazar. Consejos para seguir este plan  Pautas generales  Evite ingerir ms de 2,300 mg (miligramos) de sal (sodio) por da. Si tiene hipertensin, es posible que necesite reducir la ingesta de sodio a 1,500 mg por da.  Limite el consumo de alcohol a no ms de 1medida por da si es mujer y no est embarazada, y 2medidas por da si es hombre. Una medida equivale a 12oz (355ml) de cerveza, 5oz (148ml) de vino o 1oz (44ml) de bebidas alcohlicas de alta graduacin.  Trabaje con su mdico para mantener un peso saludable o perder peso. Pregntele cul es el peso recomendado para usted.  Realice al menos 30 minutos de ejercicio que haga que se acelere su corazn (ejercicio aerbico) la mayora de los das de la semana. Estas actividades pueden incluir caminar, nadar o andar en bicicleta.  Trabaje con su mdico o especialista en alimentacin y nutricin (nutricionista) para ajustar su plan alimentario a sus necesidades calricas personales. Lectura de las etiquetas de los alimentos   Verifique en las etiquetas de los alimentos, la cantidad de sodio por porcin. Elija alimentos con menos del 5 por ciento del valor diario de sodio. Generalmente, los alimentos con menos de 300 mg de sodio por porcin se encuadran dentro de este plan alimentario.  Para encontrar cereales integrales, busque la palabra "integral" como primera palabra en la lista de ingredientes. De compras  Compre productos en los que en su etiqueta diga: "bajo contenido de sodio" o "sin agregado de sal".  Compre alimentos frescos.  Evite los alimentos enlatados y comidas precocidas o congeladas. Coccin  Evite agregar sal cuando cocine. Use hierbas o aderezos sin sal, en lugar de sal de mesa o sal marina. Consulte al mdico o farmacutico antes de usar sustitutos de la sal.  No fra los alimentos. A la hora de cocinar los alimentos opte por hornearlos, hervirlos, grillarlos y asarlos a la parrilla.  Cocine con aceites cardiosaludables, como oliva, canola, soja o girasol. Planificacin de las comidas  Consuma una dieta equilibrada, que incluya lo siguiente: ? 5o ms porciones de frutas y verduras por da. Trate de que la mitad del plato de cada comida sean frutas y verduras. ? Hasta 6 u 8 porciones de cereales integrales por da. ? Menos de 6 onzas de carne, aves o pescado magros por da. Una porcin de 3 onzas de carne tiene casi el mismo tamao que un mazo de cartas. Un huevo equivale a 1 onza. ? Dos porciones de productos lcteos descremados por da. ? Una porcin de frutos secos, semillas o frijoles 5 veces por semana. ? Grasas cardiosaludables. Las grasas saludables llamadas cidos grasos omega-3 se encuentran en alimentos como semillas de lino y pescados de agua fra, como por ejemplo, sardinas, salmn y caballa.  Limite la cantidad que ingiere de los siguientes alimentos: ? Alimentos enlatados o envasados. ? Alimentos con alto contenido de grasa trans, como alimentos fritos. ? Alimentos con alto contenido de grasa saturada, como carne con grasa. ? Dulces, postres, bebidas azucaradas y otros alimentos con azcar agregada. ? Productos lcteos enteros.  No le agregue sal a los alimentos antes de probarlos.  Trate de comer   al menos 2 comidas vegetarianas por semana.  Consuma ms comida casera y menos de restaurante, de bufs y comida rpida.  Cuando coma en un restaurante, pida que preparen su comida con menos sal o, en lo posible, sin nada de sal. Qu alimentos se recomiendan? Los alimentos enumerados a  continuacin no constituyen una lista completa. Hable con el nutricionista sobre las mejores opciones alimenticias para usted. Cereales Pan de salvado o integral. Pasta de salvado o integral. Arroz integral. Avena. Quinua. Trigo burgol. Cereales integrales y con bajo contenido de sodio. Pan pita. Galletitas de agua con bajo contenido de grasa y sodio. Tortillas de harina integral. Verduras Verduras frescas o congeladas (crudas, al vapor, asadas o grilladas). Jugos de tomate y verduras con bajo contenido de sodio o reducidos en sodio. Salsa y pasta de tomate con bajo contenido de sodio o reducidas en sodio. Verduras enlatadas con bajo contenido de sodio o reducidas en sodio. Frutas Todas las frutas frescas, congeladas o disecadas. Frutas enlatadas en jugo natural (sin agregado de azcar). Carne y otros alimentos proteicos Pollo o pavo sin piel. Carne de pollo o de pavo molida. Cerdo desgrasado. Pescado y mariscos. Claras de huevo. Porotos, guisantes o lentejas secos. Frutos secos, mantequilla de frutos secos y semillas sin sal. Frijoles enlatados sin sal. Cortes de carne vacuna magra, desgrasada. Embutidos magros, con bajo contenido de sodio. Lcteos Leche descremada (1%) o descremada. Quesos sin grasa, con bajo contenido de grasa o descremados. Queso blanco o ricota sin grasa, con bajo contenido de sodio. Yogur semidescremado o descremado. Queso con bajo contenido de grasa y sodio. Grasas y aceites Margarinas untables que no contengan grasas trans. Aceite vegetal. Mayonesa y aderezos para ensaladas livianos o con bajo contenido de grasas (reducidos en sodio). Aceite de canola, crtamo, oliva, soja y girasol. [place]ac[place]. Condimentos y otros alimentos Hierbas. Especias. Mezclas de condimentos sin sal. Palomitas de maz y pretzels sin sal. Dulces con bajo contenido de grasas. Qu alimentos no se recomiendan? Los alimentos enumerados a continuacin no constituyen una lista completa. Hable con el  nutricionista sobre las mejores opciones alimenticias para usted. Cereales Productos de panificacin hechos con grasa, como medialunas, magdalenas y algunos panes. Comidas con arroz o pasta seca listas para usar. Verduras Verduras con crema o fritas. Verduras en salsa de queso. Verduras enlatadas regulares (que no sean con bajo contenido de sodio o reducidas en sodio). Pasta y salsa de tomates enlatadas regulares (que no sean con bajo contenido de sodio o reducidas en sodio). Jugos de tomate y verduras regulares (que no sean con bajo contenido de sodio o reducidos en sodio). Pepinillos. Aceitunas. Frutas Fruta enlatada en almbar liviano o espeso. Frutas cocidas en aceite. Frutas con salsa de crema o manteca. Carne y otros alimentos proteicos Cortes de carne con grasa. Costillas. Carne frita. Tocino. Salchichas. Mortadela y otras carnes procesadas. Salame. Panceta. Perros calientes (hotdogs). Salchicha de cerdo. Frutos secos y semillas con sal. Frijoles enlatados con agregado de sal. Pescado enlatado o ahumado. Huevos enteros o yemas. Pollo o pavo con piel. Lcteos Leche entera o al 2%, crema y mitad leche y mitad crema. Queso crema entero o con toda su grasa. Yogur entero o endulzado. Quesos con toda su grasa. Sustitutos de cremas no lcteas. Coberturas batidas. Quesos para untar y quesos procesados. Grasas y aceites Mantequilla. Margarina en barra. Manteca de cerdo. Materia grasa. Mantequilla clarificada. Grasa de panceta. Aceites tropicales como aceite de coco, palmiste o palma. Condimentos y otros alimentos Palomitas de maz y pretzels con sal. Sal   de cebolla, sal de ajo, sal condimentada, sal de mesa y sal marina. Salsa [place]. Salsa trtara. Salsa barbacoa. Salsa teriyaki. Salsa de soja, incluso la que tiene contenido reducido de sodio. Salsa de carne. Salsas en lata y envasadas. Salsa de pescado. Salsa de ostras. Salsa rosada. Rbano picante envasado. [place]. Mostaza. Saborizantes y  tiernizantes para carne. Caldo en cubitos. Salsa picante y salsa tabasco. Escabeches envasados o ya preparados. Aderezos para tacos prefabricados o envasados. Salsas. Aderezos comunes para ensalada. Dnde encontrar ms informacin:  Instituto Nacional del Corazn, los Pulmones y la Sangre (National Heart, Lung, and Blood Institute): www.nhlbi.nih.gov  Asociacin Estadounidense del Corazn (American Heart Association): www.heart.org Resumen  El plan de alimentacin DASH ha demostrado bajar la presin arterial elevada (hipertensin). Tambin puede reducir el riesgo de diabetes tipo 2, enfermedad cardaca y accidente cerebrovascular.  Con el plan de alimentacin DASH, deber limitar el consumo de sal (sodio) a 2,300 mg por da. Si tiene hipertensin, es posible que necesite reducir la ingesta de sodio a 1,500 mg por da.  Cuando siga el plan de alimentacin DASH, trate de comer ms frutas frescas y verduras, cereales integrales, carnes magras, lcteos descremados y grasas cardiosaludables.  Trabaje con su mdico o especialista en alimentacin y nutricin (nutricionista) para ajustar su plan alimentario a sus necesidades calricas personales. Esta informacin no tiene como fin reemplazar el consejo del mdico. Asegrese de hacerle al mdico cualquier pregunta que tenga. Document Released: 09/18/2011 Document Revised: 01/19/2017 Document Reviewed: 01/19/2017 Elsevier Patient Education  2020 Elsevier Inc.  

## 2019-05-11 NOTE — Progress Notes (Signed)
   Subjective:    Patient ID: Jamie Torres, female    DOB: [birth year], 63 y.o.   MRN: [id number]  HPI The patient comes in today for a wellness visit.  See discussion below  The patient recently had COVID testing which was negative Patient initially had scheduled for a wellness check but upon discussing with her she was not sure why she was here she does have an interpreter She denies any major issues.  We did cover the importance of healthy eating regular physical activity also discussed with her proper way to take her thyroid medicine plus also refills of medication and ordering of lab work  After discussion it was determined that the patient would come back for a wellness checkup later in the fall Additional concerns: pt states she did feel weak last week but this week she feels a little better.    Review of Systems  Constitutional: Negative for activity change, appetite change and fatigue.  HENT: Negative for congestion and rhinorrhea.   Respiratory: Negative for cough and shortness of breath.   Cardiovascular: Negative for chest pain and leg swelling.  Gastrointestinal: Negative for abdominal pain and diarrhea.  Endocrine: Negative for polydipsia and polyphagia.  Skin: Negative for color change.  Neurological: Negative for dizziness and weakness.  Psychiatric/Behavioral: Negative for behavioral problems and confusion.       Objective:   Physical Exam Vitals signs reviewed.  Constitutional:      General: She is not in acute distress. HENT:     Head: Normocephalic and atraumatic.  Eyes:     General:        Right eye: No discharge.        Left eye: No discharge.  Neck:     Trachea: No tracheal deviation.  Cardiovascular:     Rate and Rhythm: Normal rate and regular rhythm.     Heart sounds: Normal heart sounds. No murmur.  Pulmonary:     Effort: Pulmonary effort is normal. No respiratory distress.     Breath sounds: Normal breath sounds.  Lymphadenopathy:   Cervical: No cervical adenopathy.  Skin:    General: Skin is warm and dry.  Neurological:     Mental Status: She is alert.     Coordination: Coordination normal.  Psychiatric:        Behavior: Behavior normal.           Assessment & Plan:  Borderline blood pressure very important for the patient watch diet minimize salt exercise on a better basis follow-up in several weeks for recheck blood pressure  Hypothyroidism proper way take medication was discussed continue current medication check lab work  Follow-up for wellness visit later this fall for Pap smear patient will be doing mammogram in the near future

## 2019-05-13 ENCOUNTER — Other Ambulatory Visit: Payer: Self-pay

## 2019-05-13 ENCOUNTER — Ambulatory Visit (HOSPITAL_COMMUNITY)
Admission: RE | Admit: 2019-05-13 | Discharge: 2019-05-13 | Disposition: A | Discharge status: Home / Self Care | Payer: BC Managed Care – PPO | Source: Ambulatory Visit | Attending: Family Medicine | Admitting: Family Medicine

## 2019-05-13 DIAGNOSIS — Z1231 Encounter for screening mammogram for malignant neoplasm of breast: Secondary | ICD-10-CM | POA: Diagnosis not present

## 2019-05-17 DIAGNOSIS — Z13220 Encounter for screening for lipoid disorders: Secondary | ICD-10-CM | POA: Diagnosis not present

## 2019-05-17 DIAGNOSIS — E559 Vitamin D deficiency, unspecified: Secondary | ICD-10-CM | POA: Diagnosis not present

## 2019-05-17 DIAGNOSIS — R030 Elevated blood-pressure reading, without diagnosis of hypertension: Secondary | ICD-10-CM | POA: Diagnosis not present

## 2019-05-17 DIAGNOSIS — E039 Hypothyroidism, unspecified: Secondary | ICD-10-CM | POA: Diagnosis not present

## 2019-05-18 LAB — BASIC METABOLIC PANEL
BUN/Creatinine Ratio: 22 (ref 12–28)
BUN: 16 mg/dL (ref 8–27)
CO2: 21 mmol/L (ref 20–29)
Calcium: 9.4 mg/dL (ref 8.7–10.3)
Chloride: 105 mmol/L (ref 96–106)
Creatinine, Ser: 0.72 mg/dL (ref 0.57–1.00)
GFR calc Af Amer: 102 mL/min/{1.73_m2} (ref >59)
GFR calc non Af Amer: 89 mL/min/{1.73_m2} (ref >59)
Glucose: 107 mg/dL — ABNORMAL HIGH (ref 65–99)
Potassium: 4.6 mmol/L (ref 3.5–5.2)
Sodium: 140 mmol/L (ref 134–144)

## 2019-05-18 LAB — LIPID PANEL
Chol/HDL Ratio: 6.5 ratio — ABNORMAL HIGH (ref 0.0–4.4)
Cholesterol, Total: 221 mg/dL — ABNORMAL HIGH (ref 100–199)
HDL: 34 mg/dL — ABNORMAL LOW (ref >39)
LDL Calculated: 143 mg/dL — ABNORMAL HIGH (ref 0–99)
Triglycerides: 218 mg/dL — ABNORMAL HIGH (ref 0–149)
VLDL Cholesterol Cal: 44 mg/dL — ABNORMAL HIGH (ref 5–40)

## 2019-05-18 LAB — VITAMIN D 25 HYDROXY (VIT D DEFICIENCY, FRACTURES): Vit D, 25-Hydroxy: 34.2 ng/mL (ref 30.0–100.0)

## 2019-05-18 LAB — TSH: TSH: 1.14 u[IU]/mL (ref 0.450–4.500)

## 2019-05-24 ENCOUNTER — Encounter: Payer: Self-pay | Admitting: Family Medicine

## 2019-07-01 ENCOUNTER — Other Ambulatory Visit: Payer: Self-pay | Admitting: Family Medicine

## 2019-07-08 ENCOUNTER — Encounter: Payer: Self-pay | Admitting: Nurse Practitioner

## 2019-07-08 ENCOUNTER — Other Ambulatory Visit: Payer: Self-pay

## 2019-07-08 ENCOUNTER — Ambulatory Visit (INDEPENDENT_AMBULATORY_CARE_PROVIDER_SITE_OTHER): Payer: BC Managed Care – PPO | Admitting: Nurse Practitioner

## 2019-07-08 VITALS — BP 138/84 | Temp 97.9°F | Wt 146.2 lb

## 2019-07-08 DIAGNOSIS — Z124 Encounter for screening for malignant neoplasm of cervix: Secondary | ICD-10-CM

## 2019-07-08 DIAGNOSIS — Z01419 Encounter for gynecological examination (general) (routine) without abnormal findings: Secondary | ICD-10-CM | POA: Diagnosis not present

## 2019-07-08 DIAGNOSIS — Z1151 Encounter for screening for human papillomavirus (HPV): Secondary | ICD-10-CM

## 2019-07-08 DIAGNOSIS — Z23 Encounter for immunization: Secondary | ICD-10-CM

## 2019-07-08 NOTE — Progress Notes (Signed)
   Subjective:    Patient ID: Jamie Torres, female    DOB: 1956/04/10, 63 y.o.   MRN: [id number]  HPI Pt here today for Pap Smear only. Pt had wellness exam with Dr.Scott on 05/11/2019. Translator is present. Pt is in monogamous relationship with husband.      Review of Systems Breast: Pt denies any lump or masses, nipple discharge, breast pain, breast tenderness, or swollen lymph nodes. Denies erythema or swelling.  GU: Pt denies dysuria, hematuria, genital lesions, urgency, frequency, or incontinence. Postmenopausal: no bleeding or pelvic pain.  Objective:   Physical Exam  Breast: Breast symmetrical. No erythema or ecchymosis. No lumps or masses palpated. Non-tender. No nipple discharge or nipple retraction. No swelling or bleeding present. No adenopathy of the supraclavicular, axillary, or pectoral lymph nodes bilaterally. GU/GYN: External: No rashes or lesion present. Some atrophy present. Internal: No rashes or or lesion present. No CMT. No adnexal tenderness. No masses or tenderness during bimanual examination.       Assessment & Plan:  1. Encounter for annual routine gynecological examination - Pap IG and HPV (high risk) DNA detection  2. Screening for cervical cancer - Pap IG and HPV (high risk) DNA detection  3. Screening for HPV (human papillomavirus) - Pap IG and HPV (high risk) DNA detection  4. Need for vaccination - Flu Vaccine QUAD 6+ mos PF IM (Fluarix Quad PF)  Return in about 1 year (around 07/07/2020).

## 2019-07-08 NOTE — Progress Notes (Signed)
   Subjective:    Patient ID: Jamie Torres, female    DOB: Jul 27, 1956, 63 y.o.   MRN: [id number]  HPI Pt here today for Pap Smear only. Pt had wellness exam with Dr.Scott on 05/11/2019. Translator is present.      Review of Systems     Objective:   Physical Exam        Assessment & Plan:

## 2019-07-09 ENCOUNTER — Encounter: Payer: Self-pay | Admitting: Nurse Practitioner

## 2019-07-22 LAB — PAP IG AND HPV HIGH-RISK: HPV, high-risk: NEGATIVE

## 2019-09-13 ENCOUNTER — Other Ambulatory Visit: Payer: Self-pay | Admitting: Family Medicine

## 2019-11-21 ENCOUNTER — Other Ambulatory Visit: Payer: BC Managed Care – PPO

## 2019-11-22 ENCOUNTER — Ambulatory Visit: Payer: Self-pay | Attending: Internal Medicine

## 2019-11-22 ENCOUNTER — Other Ambulatory Visit: Payer: Self-pay

## 2019-11-22 DIAGNOSIS — Z20822 Contact with and (suspected) exposure to covid-19: Secondary | ICD-10-CM | POA: Insufficient documentation

## 2019-11-23 LAB — NOVEL CORONAVIRUS, NAA: SARS-CoV-2, NAA: NOT DETECTED

## 2019-11-28 ENCOUNTER — Telehealth: Payer: Self-pay | Admitting: Family Medicine

## 2019-11-28 MED ORDER — LEVOTHYROXINE SODIUM 112 MCG PO TABS: ORAL_TABLET | ORAL | 1 refills | Status: DC

## 2019-11-28 NOTE — Addendum Note (Signed)
Addended by: Marlowe Shores on: 11/28/2019 04:13 PM   Modules accepted: Orders

## 2019-11-28 NOTE — Telephone Encounter (Signed)
Medication sent to pharmacy  

## 2019-11-28 NOTE — Telephone Encounter (Signed)
May have this +4 refills 

## 2019-11-28 NOTE — Telephone Encounter (Signed)
[facility] on [address] requesting refill on levothyroxine 112 mcg tablets. Take one tablet po mouth daily. Pt last seen 07/08/2019 for wellness. Please advise. Thank you

## 2019-12-08 ENCOUNTER — Telehealth: Payer: Self-pay | Admitting: Family Medicine

## 2019-12-08 ENCOUNTER — Other Ambulatory Visit: Payer: Self-pay | Admitting: *Deleted

## 2019-12-08 MED ORDER — LEVOTHYROXINE SODIUM 112 MCG PO TABS: ORAL_TABLET | ORAL | 1 refills | Status: DC

## 2019-12-08 NOTE — Telephone Encounter (Signed)
Pt's daughter called to say that they went to pick up Rx for levothyroxine (SYNTHROID) 112 MCG tablet on Sunday & the pharmacy states they have not received it from [place]  Med list shows we sent it on 11/28/2019  Please resend to [facility]   Please advise & call pt

## 2019-12-08 NOTE — Telephone Encounter (Signed)
Epic show pharm received rx on the 15th but since they told pt they dont have it I resent it and pt was notified.

## 2020-01-02 ENCOUNTER — Telehealth: Payer: Self-pay | Admitting: Family Medicine

## 2020-01-02 MED ORDER — DICLOFENAC SODIUM 75 MG PO TBEC: DELAYED_RELEASE_TABLET | ORAL | 4 refills | Status: DC

## 2020-01-02 NOTE — Addendum Note (Signed)
Addended by: Marlowe Shores on: 01/02/2020 10:51 AM   Modules accepted: Orders

## 2020-01-02 NOTE — Telephone Encounter (Signed)
4 refills 

## 2020-01-02 NOTE — Telephone Encounter (Signed)
[facility] [address] requesting refill on Diclofenac 75 mg tablets. Take one tablet po BID with meal. Pt last seen 07/08/2019 for gyn exam. Please advise. Thank you

## 2020-01-02 NOTE — Telephone Encounter (Signed)
Refills sent to pharmacy. 

## 2020-01-17 ENCOUNTER — Ambulatory Visit (INDEPENDENT_AMBULATORY_CARE_PROVIDER_SITE_OTHER): Payer: Self-pay | Admitting: Family Medicine

## 2020-01-17 ENCOUNTER — Other Ambulatory Visit: Payer: Self-pay

## 2020-01-17 ENCOUNTER — Encounter: Payer: Self-pay | Admitting: Family Medicine

## 2020-01-17 VITALS — BP 136/82 | HR 76 | Temp 97.6°F | Ht <= 58 in | Wt 145.2 lb

## 2020-01-17 DIAGNOSIS — G44209 Tension-type headache, unspecified, not intractable: Secondary | ICD-10-CM

## 2020-01-17 MED ORDER — TIZANIDINE HCL 2 MG PO TABS: 2.0000 mg | ORAL_TABLET | Freq: Two times a day (BID) | ORAL | 0 refills | Status: DC | PRN

## 2020-01-17 NOTE — Progress Notes (Signed)
Subjective:    Patient ID: Jamie Torres, female    DOB: 10-14-1955, 64 y.o.   MRN: [id number]  HPI  Interpreter in the room  Patient arrives with issues with headaches for the last 3 weeks Patient also has knots behind both ears where her glasses are.  Not having dizziness or imbalance.  Just having a headache and pain above the ears and temporal area.  Sleeping well.  No injury or trauma to head recently.   No vision changes.  Got new glasses last week and hasn't helped with the pain over ears.  Pain in back of left neck.  Pain above both ears, the temporal area and over the eyebrows.  Head injury 22 yrs ago.- work injury at [employer], rack of pots and it fell on her.  No h/o migraines. No n/v/d. No URI symptoms. meds- taking advil 400mg  at night.  Review of Systems  Constitutional: Negative for chills and fever.  HENT: Negative for congestion, dental problem, ear discharge, ear pain, rhinorrhea, sinus pressure, sinus pain, sore throat and tinnitus.   Eyes: Negative for pain, discharge, itching and visual disturbance.  Respiratory: Negative for cough, shortness of breath and wheezing.   Cardiovascular: Negative for chest pain and leg swelling.  Gastrointestinal: Negative for abdominal pain, diarrhea, nausea and vomiting.  Genitourinary: Negative for dysuria and frequency.  Musculoskeletal: Positive for neck pain. Negative for arthralgias and back pain.  Skin: Negative for rash.  Neurological: Positive for headaches. Negative for dizziness, facial asymmetry, weakness and numbness.       Vitals:   01/17/20 1501  BP: 136/82  Pulse: 76  Temp: 97.6 F (36.4 C)    Objective:   Physical Exam Vitals and nursing note reviewed.  Constitutional:      General: She is not in acute distress.    Appearance: Normal appearance. She is well-developed. She is not ill-appearing.  HENT:     Head: Normocephalic and atraumatic.     Comments: +ttp over left area above the ear and  bilateral temporal area and bilateral brow bones.  No rash, erythema, or mass    Nose: Nose normal.     Mouth/Throat:     Mouth: Mucous membranes are moist.     Pharynx: Oropharynx is clear.  Eyes:     Extraocular Movements: Extraocular movements intact.     Conjunctiva/sclera: Conjunctivae normal.     Pupils: Pupils are equal, round, and reactive to light.  Neck:     Comments: +ttp over left scm.  Cardiovascular:     Rate and Rhythm: Normal rate and regular rhythm.     Pulses: Normal pulses.     Heart sounds: Normal heart sounds.  Pulmonary:     Effort: Pulmonary effort is normal.     Breath sounds: Normal breath sounds. No wheezing, rhonchi or rales.  Musculoskeletal:        General: Normal range of motion.     Cervical back: Normal range of motion.     Right lower leg: No edema.     Left lower leg: No edema.  Skin:    General: Skin is warm and dry.     Findings: No lesion or rash.  Neurological:     General: No focal deficit present.     Mental Status: She is alert and oriented to person, place, and time.     Cranial Nerves: No cranial nerve deficit or facial asymmetry.     Sensory: No sensory deficit.  Motor: No weakness.     Coordination: Coordination normal.     Gait: Gait normal.  Psychiatric:        Mood and Affect: Mood normal.        Speech: Speech normal.        Behavior: Behavior normal.        Assessment & Plan:   1. Tension headache -no sign of neuro deficits or concern at this time for mass in head. No sign of migraine qualities. Likely tension headaches or cluster headaches. Will cont to monitor.  pt taking voltaren bid, advising to continue this and can add 500mg  tylenol every 6hrs.  Heating pad to neck 3x per day.  - adding zanaflex as needed for muscle spasm. -cont to monitor for pain over the area where glasses arms are in contact with side of head.   - tiZANidine (ZANAFLEX) 2 MG tablet; Take 1 tablet (2 mg total) by mouth 2 (two) times  daily as needed for muscle spasms (Causes drowsiness, don't drive or work with this medicine).  Dispense: 20 tablet; Refill: 0  Call or rto if worsening pain or not improving over the next 2 wks.  Pt in agreement.

## 2020-01-17 NOTE — Patient Instructions (Signed)
Cefalea tensional en los adultos Tension Headache, Adult Una cefalea tensional es una sensacin de dolor o presin que suele manifestarse en la frente y a los lados de la cabeza. El dolor puede ser sordo o puede sentirse que comprime (constrictivo). Existen dos tipos de cefaleas tensionales:  Cefalea tensional episdica. Los dolores de [name] se manifiestan cada menos de 15das al [employer].  Cefalea tensional crnica. Los dolores de cabeza se manifiestan cada ms de 15das al mes, durante un perodo de . Las cefaleas tensionales pueden durar de 30 minutos a [address]. Este es el tipo ms comn de dolor de [name]. Generalmente, no se asocia con nuseas o vmitos y no empeora con la actividad fsica. Cules son las causas? Se desconoce la causa exacta de esta afeccin. Las cefaleas tensionales generalmente aparecen despus de una situacin de estrs, ansiedad o por depresin. Otros factores desencadenantes pueden incluir:  Consumir alcohol.  Demasiada cafena o abstinencia de cafena.  Infecciones respiratorias, como resfriados, gripes o sinusitis.  Problemas dentales o apretar los dientes.  Cansancio (fatiga).  Mantener la cabeza y el cuello en la misma posicin durante un perodo prolongado, por ejemplo, al usar la computadora.  Fumar.  Artritis del cuello. Cules son los signos o los sntomas? Los sntomas de esta afeccin [employer] siguientes:  Sensacin de presin o rigidez alrededor de la cabeza.  Dolor "sordo" en la cabeza.  Dolor que siente sobre la frente y los lados de la cabeza.  Sensibilidad en los msculos de la cabeza, del cuello y de los hombros. Cmo se diagnostica? Esta afeccin se puede diagnosticar en funcin de los sntomas, la historia clnica y los antecedentes mdicos. Si los sntomas son agudos o inusuales, es posible que le hagan estudios de diagnstico por imgenes, como una tomografa computarizada (TC) o una resonancia magntica (RM) de la  cabeza. Tambin le pueden revisar la vista. Cmo se trata? Esta afeccin puede tratarse con cambios en el estilo de vida y medicamentos que ayudan a Paramedic los sntomas. Siga estas indicaciones en su casa: Control del [employer] de venta libre y los recetados solamente como se lo haya indicado el mdico.  Cuando sienta dolor de cabeza acustese en un cuarto oscuro y tranquilo.  Si se lo indican, aplique hielo sobre la cabeza y el cuello: ? Ponga el hielo en una bolsa plstica. ? Coloque una [employer] piel y la bolsa de hielo. ? Coloque el hielo durante , de 2 a 3veces por da.  Si se lo indican, aplique calor en la zona posterior del cuello con la frecuencia que le haya indicado el mdico. Use la fuente de calor que el mdico le recomiende, como una compresa de calor hmedo o una almohadilla trmica. ? Coloque una [employer] piel y la fuente de Airline pilot. ? Aplique el calor durante 20 a . ? Retire la fuente de calor si la piel se pone de color rojo brillante. Esto es muy importante si no puede Financial risk analyst, calor o fro. Puede correr un riesgo mayor de sufrir quemaduras. Comida y bebida  Mantenga un horario para las comidas.  Limite el consumo de alcohol a no ms de por da si es mujer y no est [place], y por da si es hombre. Una medida equivale a 12oz ( ) de cerveza, 5oz ( ) de vino o 1oz (8ml) de bebidas alcohlicas de alta graduacin.  Beba suficiente lquido para Photographer orina de color amarillo plido.  Disminuya el  consumo de cafena o deje de consumir cafena. Estilo de vida  Duerma entre 7 y 9horas o la cantidad de horas que le haya recomendado el mdico.  A la hora de dormir, retire todos los dispositivos electrnicos de su habitacin. Carbon computadoras, telfonos y tabletas.  Busque maneras de Scientific laboratory technician. Entre las cosas que pueden  ayudar a Public house manager el estrs se incluyen: ? Actividad fsica. ? Ejercicios de respiracin profunda. ? Practicar yoga. ? Conservation officer, nature. ? Visualizaciones positivas.  Trate de sentarse derecho y [employer].  No consuma ningn producto que contenga nicotina o tabaco, como cigarrillos y Psychologist, sport and exercise. Si necesita ayuda para dejar de fumar, consulte al mdico. Instrucciones generales   Concurra a todas las visitas de control como se lo haya indicado el mdico. Esto es importante.  Evite cualquier desencadenante del dolor de cabeza. Lleve un diario de los dolores de cabeza para Neurosurgeon qu factores pueden desencadenarlos. Por ejemplo, escriba: ? Lo que usted come y bebe. ? Cunto tiempo duerme. ? Algn cambio en su dieta o en los medicamentos. Comunquese con un mdico si:  Si la cefalea no mejora.  La cefalea regresa.  Tiene sensibilidad a los sonidos, la luz o los olores debido a Scientific laboratory technician.  Tiene nuseas o vmitos.  Le duele el estmago. Solicite ayuda de inmediato si:  Aparece una cefalea muy aguda y repentina junto con alguno de los siguientes sntomas: ? Rigidez en el cuello. ? Nuseas y vmitos. ? Confusin. ? Debilidad. ? Visin doble o prdida de la visin. ? Falta de aire. ? Erupcin cutnea. ? Somnolencia inusual. ? Cristy Hilts. ? Dificultad para hablar. ? Dolor en los ojos o los odos. ? Dificultad para caminar o mantener el equilibrio. ? Mareo o desmayo. Resumen  Una cefalea tensional es una sensacin de dolor o presin que suele manifestarse en la frente y a los lados de la cabeza.  Las cefaleas tensionales pueden durar de 30 minutos a varios das. Este es el tipo ms comn de dolor de [place].  Esta afeccin se puede diagnosticar en funcin de los sntomas, la historia clnica y los antecedentes mdicos.  Esta afeccin puede tratarse con cambios en el estilo de vida y medicamentos que ayudan a Public house manager los sntomas. Esta  informacin no tiene Marine scientist el consejo del mdico. Asegrese de hacerle al mdico cualquier pregunta que tenga. Document Revised: 05/04/2017 Document Reviewed: 05/04/2017 Elsevier Patient Education  [place].   Tizanidine tablets or capsules Qu es este medicamento? La TIZANIDINA ayuda a [employer]. Se utiliza tambin en el tratamiento de esclerosis mltiple o lesiones en la columna vertebral. Este medicamento puede ser utilizado para otros usos; si tiene alguna pregunta consulte con su proveedor de atencin mdica o con su farmacutico. MARCAS COMUNES: Zanaflex Qu le debo informar a mi profesional de la salud antes de tomar este medicamento? Necesita saber si usted presenta alguno de los [employer] o situaciones:  enfermedad renal  enfermedad heptica  baja presin sangunea  trastorno mental  una reaccin alrgica o inusual a la tizanidina, a otros medicamentos, a la lactosa (tabletas solamente), alimentos, colorantes o conservantes  si est embarazada o buscando quedar embarazada  si est amamantando a un beb Cmo debo utilizar este medicamento? Tome este medicamento por va oral con un vaso lleno de agua. Tome este medicamento con el estmago vaco, por lo menos 30 minutos antes o 2 horas despus de la comida. No lo  tome con alimentos sin consultar a su mdico. Siga las instrucciones de la etiqueta del [place]. Tome sus dosis a intervalos regulares. No tome su medicamento con una frecuencia mayor a la indicada. No deje de tomarlo excepto si as lo indica su mdico. Es peligroso suspender su medicamento de [place]. Hable con su pediatra para informarse acerca del uso de este medicamento en nios. Puede requerir Customer service manager. Los pacientes de ms de 65 aos de edad pueden presentar reacciones ms fuertes y Pension scheme manager dosis [employer]. Sobredosis: Pngase en contacto inmediatamente con un centro toxicolgico o una  sala de urgencia si usted cree que haya tomado demasiado medicamento. ATENCIN: [employer] es solo para usted. No comparta este medicamento con nadie. Qu sucede si me olvido de una dosis? Si olvida una dosis, tmela lo antes posible. Si es casi la hora de la prxima dosis, tome slo esa dosis. No tome dosis adicionales o dobles. Qu puede interactuar con este medicamento? No tome este medicamento con ninguno de los siguientes frmacos: ciprofloxacino fluvoxamina medicamentos narcticos para la tos tiabendazol Esta medicina tambin puede interactuar con los siguientes medicamentos: aciclovir alcohol antihistamnicos para Programmer, multimedia, tos y resfro baclofeno ciertos medicamentos para la ansiedad o para dormir ciertos medicamentos para la presin sangunea, enfermedad cardiaca y ritmo cardiaco irregular ciertos medicamentos para la depresin, como amitriptilina, fluoxetina, sertralina ciertos medicamentos para convulsiones, tales como fenobarbital, primidona ciertos medicamentos para problemas estomacales, tales como cimetidina, famotidina hormonas femeninas, como estrgenos o progestinas, y pldoras, parches, anillos o inyecciones anticonceptivos anestsicos generales, tales como halotano, isoflurano, metoxiflurano, propofol anestsicos locales, tales como lidocana, pramoxina, tetracana medicamentos para relajar los msculos antes de una ciruga medicamentos narcticos para el dolor fenotiazinas, tales como clorpromazina, mesoridazina, proclorperazina ticlopidina zileutn Puede ser que esta lista no menciona todas las posibles interacciones. Informe a su profesional de [employer] de [employer] productos a base de hierbas, medicamentos de [place] o suplementos nutritivos que est tomando. Si usted fuma, consume bebidas alcohlicas o si utiliza drogas ilegales, indqueselo tambin a su profesional de [employer]. Algunas sustancias pueden interactuar con su medicamento. A qu debo estar atento al usar  [employer]? Informe a su mdico o a su profesional de la salud si sus sntomas no comienzan a mejorar o si empeoran. Puede experimentar somnolencia o mareos. No conduzca, no utilice maquinaria ni haga nada que Scientist, research (life sciences) en estado de alerta hasta que sepa cmo le afecta este medicamento. No se siente ni se ponga de pie con rapidez, especialmente si es un paciente de edad avanzada. Esto reduce el riesgo de mareos o [employer]. El alcohol puede interferir con el efecto de [place]. Evite consumir bebidas alcohlicas. Si est tomando otro medicamento que tambin causa somnolencia, es posible que tenga ms efectos secundarios. Entrguele a su proveedor de atencin mdica una lista de todos los medicamentos que [place]. Su mdico le dir cunto Risk manager. No tome ms medicamento que lo indicado. Llame al servicio de emergencias para recibir ayuda si tiene problemas para respirar o somnolencia inusual. Se le podra secar la boca. Masticar chicle sin azcar, chupar caramelos duros y tomar agua en abundancia lo ayudar a mantener la boca hmeda. Si el problema no desaparece o es severo, consulte a su mdico. Qu efectos secundarios puedo tener al [employer] este medicamento? Efectos secundarios que debe informar a su mdico o a Producer, television/film/video de la salud tan pronto como sea posible: Therapist, art, como erupcin cutnea, comezn/picazn o urticarias, hinchazn de la cara, los labios  o la lengua problemas respiratorios alucinaciones signos y sntomas de lesin al hgado, como orina amarilla oscura o [place]; sensacin general de estar enfermo o sntomas gripales; heces claras; prdida de apetito; nuseas; dolor en la regin abdominal superior derecha; cansancio o debilidad inusual; color amarillento de los ojos o la piel signos y sntomas de presin sangunea baja, tales como mareos, sensacin de [place] o aturdimiento, cadas, cansancio o debilidad inusual pulso cardiaco lento, inusual  cansancio o debilidad inusual Efectos secundarios que generalmente no requieren atencin mdica (debe informarlos a su mdico o a Producer, television/film/video de la salud si persisten o si son molestos): visin borrosa estreimiento mareos boca seca cansancio Puede ser que esta lista no menciona todos los posibles efectos secundarios. Comunquese a su mdico por asesoramiento mdico [employer]. Usted puede informar los efectos secundarios a la FDA por telfono al 1-800-FDA-1088. Dnde debo guardar mi medicina? Mantngala fuera del alcance de los nios. Gurdela a [employer], entre 15 y 30 grados C (24 y 44 grados F). Deseche los medicamentos que no haya utilizado, despus de la fecha de vencimiento. ATENCIN: Este folleto es un resumen. Puede ser que no cubra toda la posible informacin. Si usted tiene preguntas acerca de esta medicina, consulte con su mdico, su farmacutico o su profesional de Radiographer, therapeutic.  2020 Elsevier/Gold Standard (2017-09-07 00:00:00)

## 2020-02-01 ENCOUNTER — Other Ambulatory Visit: Payer: Self-pay | Admitting: Family Medicine

## 2020-02-01 DIAGNOSIS — G44209 Tension-type headache, unspecified, not intractable: Secondary | ICD-10-CM

## 2020-02-14 ENCOUNTER — Other Ambulatory Visit: Payer: Self-pay

## 2020-02-14 ENCOUNTER — Ambulatory Visit (INDEPENDENT_AMBULATORY_CARE_PROVIDER_SITE_OTHER): Payer: Medicare Other | Admitting: Family Medicine

## 2020-02-14 ENCOUNTER — Encounter: Payer: Self-pay | Admitting: Family Medicine

## 2020-02-14 VITALS — BP 132/88 | Temp 98.3°F | Ht <= 58 in | Wt 143.0 lb

## 2020-02-14 DIAGNOSIS — R519 Headache, unspecified: Secondary | ICD-10-CM | POA: Diagnosis not present

## 2020-02-14 DIAGNOSIS — G4489 Other headache syndrome: Secondary | ICD-10-CM | POA: Diagnosis not present

## 2020-02-14 DIAGNOSIS — G44209 Tension-type headache, unspecified, not intractable: Secondary | ICD-10-CM

## 2020-02-14 MED ORDER — DICLOFENAC SODIUM 75 MG PO TBEC: DELAYED_RELEASE_TABLET | ORAL | 4 refills | Status: DC

## 2020-02-14 NOTE — Progress Notes (Signed)
Patient ID: Jamie Torres, female    DOB: Aug 04, 1956, 64 y.o.   MRN: [id number]   Chief Complaint  Patient presents with  . Headache    Pt here today for follow up on headaches. Pt states her headaches have become better. Pt is having trouble going to sleep and staying asleep.    Subjective:    HPI  Pt seen with daughter, [place].  She is spanish speaking, daughter helping with translation.  Pt seen for f/u on headaches and scalp pain. Stating the pain is 4-5/10.  Intermittent.  Only really hurts if she touches the area above the ears bilaterally. No vision changes or weakness/numbness in arms/legs. Neck pain has improved.   Having some insomnia and unable to sleep or stay asleep.  Mostly stating due to the head pain. Tender when laying on the left side of her head.   Per the son, she used to see neurologist for this and get injections to the scalp for the pain.  They don't know how long ago it was that she saw neuro.  Last head CT was in 2014- normal. Daughter stating had head CT in [place] also, don't have the results.  Medical History Arron has a past medical history of Arthritis, Hypercholesteremia, Hypertension, Hypothyroidism, Plantar fasciitis, TB (pulmonary tuberculosis), and Thyroid disease.   Outpatient Encounter Medications as of 02/14/2020  Medication Sig  . albuterol (PROVENTIL HFA;VENTOLIN HFA) 108 (90 Base) MCG/ACT inhaler Inhale 2 puffs into the lungs every 6 (six) hours as needed for wheezing.  . diclofenac (VOLTAREN) 75 MG EC tablet TAKE 1 TABLET BY MOUTH TWICE DAILY WITH A MEAL  . fish oil-omega-3 fatty acids 1000 MG capsule Take 2 g by mouth daily.  Marland Kitchen levothyroxine (SYNTHROID) 112 MCG tablet TAKE 1 TABLET(112 MCG) BY MOUTH DAILY  . tiZANidine (ZANAFLEX) 2 MG tablet TAKE 1 TABLET BY MOUTH TWICE DAILY AS NEEDED FOR MUSCLE SPASMS (Patient taking differently: Take 4 mg by mouth at bedtime. Prn neck pain.)  . [DISCONTINUED] diclofenac (VOLTAREN) 75 MG EC tablet  TAKE 1 TABLET BY MOUTH TWICE DAILY WITH A MEAL   No facility-administered encounter medications on file as of 02/14/2020.     Review of Systems  Constitutional: Negative for chills and fever.  HENT: Negative for congestion, rhinorrhea and sore throat.   Respiratory: Negative for cough, shortness of breath and wheezing.   Cardiovascular: Negative for chest pain and leg swelling.  Gastrointestinal: Negative for abdominal pain, diarrhea, nausea and vomiting.  Genitourinary: Negative for dysuria and frequency.  Musculoskeletal: Positive for neck pain (improved). Negative for arthralgias and back pain.  Skin: Negative for rash.  Neurological: Positive for headaches (bilateral above ears). Negative for dizziness, seizures, syncope, facial asymmetry, speech difficulty, weakness and numbness.  Psychiatric/Behavioral: Positive for sleep disturbance.     Vitals BP 132/88   Temp 98.3 F (36.8 C)   Ht 4\' 10"  (1.473 m)   Wt 143 lb (64.9 kg)   SpO2 100%   BMI 29.89 kg/m   Objective:   Physical Exam Vitals and nursing note reviewed.  Constitutional:      Appearance: Normal appearance.  HENT:     Head: Normocephalic and atraumatic.     Comments: ttp over the parietal area bilaterally when palpating. No rash, erythema or warmth to scalp.  No ttp over maxillary or frontal sinuses.    Nose: Nose normal.     Mouth/Throat:     Mouth: Mucous membranes are moist.     Pharynx:  Oropharynx is clear.  Eyes:     Extraocular Movements: Extraocular movements intact.     Conjunctiva/sclera: Conjunctivae normal.     Pupils: Pupils are equal, round, and reactive to light.  Cardiovascular:     Rate and Rhythm: Normal rate and regular rhythm.     Pulses: Normal pulses.     Heart sounds: Normal heart sounds.  Pulmonary:     Effort: Pulmonary effort is normal.     Breath sounds: Normal breath sounds. No wheezing, rhonchi or rales.  Musculoskeletal:        General: No swelling. Normal range of  motion.     Cervical back: Normal range of motion and neck supple.     Right lower leg: No edema.     Left lower leg: No edema.  Skin:    General: Skin is warm and dry.     Findings: No lesion or rash.  Neurological:     General: No focal deficit present.     Mental Status: She is alert and oriented to person, place, and time.  Psychiatric:        Mood and Affect: Mood normal.        Behavior: Behavior normal.      Assessment and Plan   1. Other headache syndrome - Ambulatory referral to Neurology  2. Scalp pain - Ambulatory referral to Neurology  3. Tension headache    For the scalp pain and tension headaches, will refer to neurologist.  Pt seen in past by Neurologist and had "injections", not sure if lidocaine or botox.    - call the sister for the neurology follow up-  [phone], rosa for appt.  Advising pt to increase from 2mg   zanaflex to 4mg  tablet qhs for neck pain and sleep. Continue with diclofenac 75mg  bid for arthritis and headaches.  Wrote directions on how to take the pain and spasm medications.  Cool compress and heating pad as needed.  reviewed this with daughter and patient.   F/u prn.  Erin Obando , DO 02/14/2020

## 2020-02-14 NOTE — Patient Instructions (Signed)
Cefalea tensional en los adultos Tension Headache, Adult Una cefalea tensional es una sensacin de dolor o presin que suele manifestarse en la frente y a los lados de la cabeza. El dolor puede ser sordo o puede sentirse que comprime (constrictivo). Existen dos tipos de cefaleas tensionales:  Cefalea tensional episdica. Los dolores de [name] se manifiestan cada menos de 15das al [employer].  Cefalea tensional crnica. Los dolores de cabeza se manifiestan cada ms de 15das al mes, durante un perodo de . Las cefaleas tensionales pueden durar de 30 minutos a Esguince cervical Cervical Sprain  Un esguince cervical es un estiramiento o desgarro de los tejidos que [employer] huesos (ligamentos) del cuello. La mayora de los esguinces de cuello (cervicales) mejoran en un lapso de 4 a 6semanas. Siga estas indicaciones en su casa: Si tiene un collarn para el cuello:  selo como se lo haya indicado el mdico. No se lo quite (no lo extraiga), a menos que el mdico lo autorice.  Consulte con el mdico antes de ajustarlo.  Si tiene [employer], mantngalo fuera del collarn.  Pregntele al mdico si puede quitarse el collarn para la limpieza y el bao. Si puede quitarse el collarn: ? Siga las indicaciones del mdico sobre cmo quitarse el collarn de forma segura. ? Limpie el collarn pasndole un pao con agua y [place]. Deje que se seque con el aire por completo. ? Si el collarn tiene almohadillas desmontables:  Saque las almohadillas cada 1 o 2 das.  Lvelas a mano con agua y [place].  Djelas secar con el aire por completo antes de volver a ponerlas en el collarn. No las seque en la secadora. No las seque con el secador de pelo. ? Contrlese la piel debajo del collarn para ver si hay irritacin o llagas. Avsele al mdico si ve algo de lo anterior. Control del dolor, la rigidez y la hinchazn   Si el mdico se lo indica, use un dispositivo de traccin cervical.  Si se  lo indican, aplique calor en la zona afectada. Haga esto antes de los ejercicios (fisioterapia) o con la frecuencia que le haya indicado el mdico. Use la fuente de calor que el mdico le recomiende, como una compresa de calor hmedo o una almohadilla trmica. ? Coloque una [employer] piel y la fuente de Airline pilot. ? Aplique el calor durante 20 a . ? Quite la fuente de calor (retire la fuente de calor) si la piel se le pone de color rojo brillante. Esto es muy importante si no puede sentir el dolor, el calor o el fro. Puede correr un riesgo mayor de sufrir quemaduras.  Aplique hielo sobre la zona afectada. ? Ponga el hielo en una bolsa plstica. ? Coloque una [employer] piel y la bolsa de hielo. ? Coloque el hielo durante , de 2 a 3veces por da. Actividad  No conduzca vehculos mientras [place] el collar. Si no tiene un collarn cervical, pregntele al mdico si puede conducir sin correr [employer].  No conduzca ni use maquinarias pesadas mientras tome analgsicos o relajantes musculares recetados, a menos que el mdico lo haya autorizado.  No levante nada que pese ms de 10lb (4,5kg) hasta que el mdico le diga que puede hacerlo sin correr Herbalist.  Haga reposo como se lo haya indicado el mdico.  Evite realizar actividades que lo hagan Chief Operating Officer. Pregntele al mdico qu actividades son seguras para usted.  Haga ejercicios como se lo hayan indicado el mdico o  el fisioterapeuta. Prevencin de esguinces cervicales  Adopte una buena postura. Modifique su lugar de trabajo para lograrlo, si es necesario.  Haga ejercicios de forma regular como se lo haya indicado el mdico o el fisioterapeuta.  Evite realizar actividades riesgosas o que podran causar un esguince de cuello (esguince cervical). Instrucciones generales  [employer] de venta libre y los recetados solamente como se lo haya indicado el mdico.  No consuma ningn producto que contenga  nicotina o tabaco. Esto incluye cigarrillos y cigarrillos electrnicos. Si necesita ayuda para dejar de fumar, consulte al mdico.  Concurra a todas las visitas de seguimiento como se lo haya indicado el mdico. Esto es importante. Comunquese con un mdico si:  Tiene dolor u otros sntomas que empeoran.  Tiene sntomas que no mejoran despus de 2semanas.  El dolor no mejora con medicamentos.  Le aparecen nuevos sntomas inexplicables.  Tiene llagas o la piel irritada por usar el collarn para el cuello. Solicite ayuda de inmediato si:  Interior and spatial designer.  Le aparece alguno de los siguientes en alguna parte del cuerpo: ? Prdida de la sensibilidad (adormecimiento). ? Hormigueo. ? Debilidad.  No puede mover una parte del cuerpo (tiene parlisis).  El [place] de [place] no [place]. Resumen  Un esguince cervical es un estiramiento o desgarro de los tejidos que [employer] (ligamentos) del cuello.  Si tiene un collarn para el cuello (cervical), no se lo quite, a menos que el mdico lo autorice.  Coloque hielo en las zonas afectadas como se lo haya indicado el mdico.  Aplique calor en las zonas afectadas como se lo haya indicado el mdico.  Una buena postura y la realizacin habitual de ejercicios pueden ayudar a prevenir que vuelva a ocurrir un esguince de cuello. Esta informacin no tiene Marine scientist el consejo del mdico. Asegrese de hacerle al mdico cualquier pregunta que tenga. Document Revised: 05/19/2017 Document Reviewed: 04/06/2013 Elsevier Patient Education  2020 [employer]. Este es el tipo ms comn de dolor de [place]. Generalmente, no se asocia con nuseas o vmitos y no empeora con la actividad fsica. Cules son las causas? Se desconoce la causa exacta de esta afeccin. Las cefaleas tensionales generalmente aparecen despus de una situacin de estrs, ansiedad o por depresin. Otros factores desencadenantes pueden  incluir:  Consumir alcohol.  Demasiada cafena o abstinencia de cafena.  Infecciones respiratorias, como resfriados, gripes o sinusitis.  Problemas dentales o apretar los dientes.  Cansancio (fatiga).  Mantener la cabeza y el cuello en la misma posicin durante un perodo prolongado, por ejemplo, al usar la computadora.  Fumar.  Artritis del cuello. Cules son los signos o los sntomas? Los sntomas de esta afeccin [employer] siguientes:  Sensacin de presin o rigidez alrededor de la cabeza.  Dolor "sordo" en la cabeza.  Dolor que siente sobre la frente y los lados de la cabeza.  Sensibilidad en los msculos de la cabeza, del cuello y de los hombros. Cmo se diagnostica? Esta afeccin se puede diagnosticar en funcin de los sntomas, la historia clnica y los antecedentes mdicos. Si los sntomas son agudos o inusuales, es posible que le hagan estudios de diagnstico por imgenes, como una tomografa computarizada (TC) o una resonancia magntica (RM) de la cabeza. Tambin le pueden revisar la vista. Cmo se trata? Esta afeccin puede tratarse con cambios en el estilo de vida y medicamentos que ayudan a Public house manager los sntomas. Siga estas indicaciones en su casa: [place]  los medicamentos de venta libre y los recetados solamente como se lo haya indicado el mdico.  Cuando sienta dolor de cabeza acustese en un cuarto oscuro y tranquilo.  Si se lo indican, aplique hielo sobre la cabeza y el cuello: ? Ponga el hielo en una bolsa plstica. ? Coloque una [employer] piel y la bolsa de hielo. ? Coloque el hielo durante , de 2 a 3veces por da.  Si se lo indican, aplique calor en la zona posterior del cuello con la frecuencia que le haya indicado el mdico. Use la fuente de calor que el mdico le recomiende, como una compresa de calor hmedo o una almohadilla trmica. ? Coloque una [employer] piel y la fuente de Airline pilot. ? Aplique el calor  durante 20 a . ? Retire la fuente de calor si la piel se pone de color rojo brillante. Esto es muy importante si no puede Financial risk analyst, calor o fro. Puede correr un riesgo mayor de sufrir quemaduras. Comida y bebida  Mantenga un horario para las comidas.  Limite el consumo de alcohol a no ms de por da si es mujer y no est [place], y por da si es hombre. Una medida equivale a 12oz ( ) de cerveza, 5oz ( ) de vino o 1oz (38ml) de bebidas alcohlicas de alta graduacin.  Beba suficiente lquido para Photographer orina de color amarillo plido.  Disminuya el consumo de cafena o deje de consumir cafena. Estilo de vida  Duerma entre 7 y 9horas o la cantidad de horas que le haya recomendado el mdico.  A la hora de dormir, retire todos los dispositivos electrnicos de su habitacin. Los dispositivos electrnicos incluyen las computadoras, telfonos y tabletas.  Busque maneras de Charity fundraiser. Entre las cosas que pueden ayudar a Paramedic el estrs se incluyen: ? Actividad fsica. ? Ejercicios de respiracin profunda. ? Practicar yoga. ? Optometrist. ? Visualizaciones positivas.  Trate de sentarse derecho y [employer].  No consuma ningn producto que contenga nicotina o tabaco, como cigarrillos y Administrator, Civil Service. Si necesita ayuda para dejar de fumar, consulte al mdico. Instrucciones generales   Concurra a todas las visitas de control como se lo haya indicado el mdico. Esto es importante.  Evite cualquier desencadenante del dolor de cabeza. Lleve un diario de los dolores de cabeza para Financial risk analyst qu factores pueden desencadenarlos. Por ejemplo, escriba: ? Lo que usted come y bebe. ? Cunto tiempo duerme. ? Algn cambio en su dieta o en los medicamentos. Comunquese con un mdico si:  Si la cefalea no mejora.  La cefalea regresa.  Tiene sensibilidad a los sonidos, la luz o los olores debido a  Surveyor, quantity.  Tiene nuseas o vmitos.  Le duele el estmago. Solicite ayuda de inmediato si:  Aparece una cefalea muy aguda y repentina junto con alguno de los siguientes sntomas: ? Rigidez en el cuello. ? Nuseas y vmitos. ? Confusin. ? Debilidad. ? Visin doble o prdida de la visin. ? Falta de aire. ? Erupcin cutnea. ? Somnolencia inusual. ? Grant Ruts. ? Dificultad para hablar. ? Dolor en los ojos o los odos. ? Dificultad para caminar o mantener el equilibrio. ? Mareo o desmayo. Resumen  Una cefalea tensional es una sensacin de dolor o presin que suele manifestarse en la frente y a los lados de la cabeza.  Las cefaleas tensionales pueden durar de 30 minutos a [address]. Este es el tipo ms comn de dolor de [name].  Esta afeccin se puede diagnosticar en funcin de los sntomas, la historia clnica y los antecedentes mdicos.  Esta afeccin puede tratarse con cambios en el estilo de vida y medicamentos que ayudan a Paramedic los sntomas. Esta informacin no tiene Theme park manager el consejo del mdico. Asegrese de hacerle al mdico cualquier pregunta que tenga. Document Revised: 05/04/2017 Document Reviewed: 05/04/2017 Elsevier Patient Education  2020 [employer].

## 2020-02-16 ENCOUNTER — Telehealth: Payer: Self-pay | Admitting: Family Medicine

## 2020-02-16 NOTE — Telephone Encounter (Signed)
Called pharm and was told that they already told pt's daughter that diclofenac was picked up yesterday. I tried to call the number on the message and was unable to speak with anyone.

## 2020-02-16 NOTE — Telephone Encounter (Signed)
Pt's daughter calling stating pharmacy does not have her medication   diclofenac (VOLTAREN) 75 MG EC tablet

## 2020-02-20 NOTE — Telephone Encounter (Signed)
Telephone call-voicemail not set up ?

## 2020-02-21 NOTE — Telephone Encounter (Signed)
Patient picked up her medication last week.

## 2020-02-23 ENCOUNTER — Encounter: Payer: Self-pay | Admitting: Family Medicine

## 2020-03-07 DIAGNOSIS — Z049 Encounter for examination and observation for unspecified reason: Secondary | ICD-10-CM | POA: Diagnosis not present

## 2020-03-07 DIAGNOSIS — Z79899 Other long term (current) drug therapy: Secondary | ICD-10-CM | POA: Diagnosis not present

## 2020-03-07 DIAGNOSIS — G44229 Chronic tension-type headache, not intractable: Secondary | ICD-10-CM | POA: Diagnosis not present

## 2020-03-07 DIAGNOSIS — G4452 New daily persistent headache (NDPH): Secondary | ICD-10-CM | POA: Diagnosis not present

## 2020-03-13 DIAGNOSIS — M791 Myalgia, unspecified site: Secondary | ICD-10-CM | POA: Diagnosis not present

## 2020-03-13 DIAGNOSIS — G4452 New daily persistent headache (NDPH): Secondary | ICD-10-CM | POA: Diagnosis not present

## 2020-03-13 DIAGNOSIS — G44229 Chronic tension-type headache, not intractable: Secondary | ICD-10-CM | POA: Diagnosis not present

## 2020-03-13 DIAGNOSIS — M542 Cervicalgia: Secondary | ICD-10-CM | POA: Diagnosis not present

## 2020-03-27 DIAGNOSIS — G4452 New daily persistent headache (NDPH): Secondary | ICD-10-CM | POA: Diagnosis not present

## 2020-03-27 DIAGNOSIS — G44229 Chronic tension-type headache, not intractable: Secondary | ICD-10-CM | POA: Diagnosis not present

## 2020-04-24 ENCOUNTER — Other Ambulatory Visit (HOSPITAL_COMMUNITY): Payer: Self-pay | Admitting: Family Medicine

## 2020-04-24 DIAGNOSIS — Z1231 Encounter for screening mammogram for malignant neoplasm of breast: Secondary | ICD-10-CM

## 2020-05-16 ENCOUNTER — Other Ambulatory Visit: Payer: Self-pay

## 2020-05-16 ENCOUNTER — Ambulatory Visit (HOSPITAL_COMMUNITY)
Admission: RE | Admit: 2020-05-16 | Discharge: 2020-05-16 | Disposition: A | Discharge status: Home / Self Care | Payer: Medicare Other | Source: Ambulatory Visit | Attending: Family Medicine | Admitting: Family Medicine

## 2020-05-16 DIAGNOSIS — Z1231 Encounter for screening mammogram for malignant neoplasm of breast: Secondary | ICD-10-CM | POA: Diagnosis not present

## 2020-06-01 ENCOUNTER — Other Ambulatory Visit: Payer: Self-pay | Admitting: Family Medicine

## 2020-06-01 NOTE — Telephone Encounter (Signed)
Scheduled 9/21

## 2020-06-01 NOTE — Telephone Encounter (Signed)
Pt states daughter will call

## 2020-06-12 ENCOUNTER — Telehealth (INDEPENDENT_AMBULATORY_CARE_PROVIDER_SITE_OTHER): Payer: Medicare Other | Admitting: Nurse Practitioner

## 2020-06-12 ENCOUNTER — Encounter: Payer: Self-pay | Admitting: Nurse Practitioner

## 2020-06-12 ENCOUNTER — Telehealth: Payer: Self-pay | Admitting: Nurse Practitioner

## 2020-06-12 DIAGNOSIS — N390 Urinary tract infection, site not specified: Secondary | ICD-10-CM

## 2020-06-12 MED ORDER — CEFPROZIL 500 MG PO TABS: 500.0000 mg | ORAL_TABLET | Freq: Two times a day (BID) | ORAL | 0 refills | Status: DC

## 2020-06-12 NOTE — Telephone Encounter (Signed)
[name], mcnulty are scheduled for a virtual visit with your provider today.    Just as we do with appointments in the office, we must obtain your consent to participate.  Your consent will be active for this visit and any virtual visit you may have with one of our providers in the next 365 days.    If you have a MyChart account, I can also send a copy of this consent to you electronically.  All virtual visits are billed to your insurance company just like a traditional visit in the office.  As this is a virtual visit, video technology does not allow for your provider to perform a traditional examination.  This may limit your provider's ability to fully assess your condition.  If your provider identifies any concerns that need to be evaluated in person or the need to arrange testing such as labs, EKG, etc, we will make arrangements to do so.    Although advances in technology are sophisticated, we cannot ensure that it will always work on either your end or our end.  If the connection with a video visit is poor, we may have to switch to a telephone visit.  With either a video or telephone visit, we are not always able to ensure that we have a secure connection.   I need to obtain your verbal consent now.   Are you willing to proceed with your visit today?   Jamie Torres has provided verbal consent on 06/12/2020 for a virtual visit (video or telephone).   Marlowe Shores, LPN 8/[id number]/[id number]  30:16 AM

## 2020-06-12 NOTE — Progress Notes (Signed)
Pt having pain with urination. When pt walking the pain always intensifies per daughter. Has been going on about 1-2 weeks. Daughter victoria states that it went away and came back. Pt has tried Advil.   Virtual Visit via Video Note  I connected with Rodman Key on 06/12/20 at  3:00 PM EDT by a video enabled telemedicine application and verified that I am speaking with the correct person using two identifiers.  Location: Patient: home Provider: office   I discussed the limitations of evaluation and management by telemedicine and the availability of in person appointments. The patient expressed understanding and agreed to proceed.  History of Present Illness: Presents by video today.  Her daughter is present for interpretation.  Began having dysuria about 1-2 weeks ago.  Began taking some OTC medication for UTI, symptoms improved but then came back.  Over the weekend had pressure and pain in the lower abdomen when she was walking, this has improved.  Continues to have mild lower abdominal pain.  No fever.  Having urgency and frequency.  No nausea vomiting.  Taking fluids well.  No hematuria.  No previous history of UTI that she knows of.  No current vaginal discharge, had some a few weeks ago, took OTC vaginal yeast medicine, this resolved.  Denies mid back pain or flank pain.   Observations/Objective: NAD.  Alert, oriented.  Cheerful affect.  Her daughter did gentle percussion over the CVA area which produced no tenderness.  Assessment and Plan: Acute UTI  Meds ordered this encounter  Medications  . cefPROZIL (CEFZIL) 500 MG tablet    Sig: Take 1 tablet (500 mg total) by mouth 2 (two) times daily.    Dispense:  14 tablet    Refill:  0    Order Specific Question:   Supervising Provider    Answer:   Lilyan Punt A [[id number]]     Follow Up Instructions: Start Cefzil as directed.  Take OTC Pyridium as directed for 48 hours then discontinue.  Warning signs reviewed including fever  vomiting mid back pain.  Increase clear fluid intake.  Call back in 48-72 hours if no improvement, sooner if worse.   I discussed the assessment and treatment plan with the patient. The patient was provided an opportunity to ask questions and all were answered. The patient agreed with the plan and demonstrated an understanding of the instructions.   The patient was advised to call back or seek an in-person evaluation if the symptoms worsen or if the condition fails to improve as anticipated.  I provided 15 minutes of non-face-to-face time during this encounter.

## 2020-07-03 ENCOUNTER — Ambulatory Visit (INDEPENDENT_AMBULATORY_CARE_PROVIDER_SITE_OTHER): Payer: Medicare Other | Admitting: Family Medicine

## 2020-07-03 ENCOUNTER — Other Ambulatory Visit: Payer: Self-pay

## 2020-07-03 ENCOUNTER — Encounter: Payer: Self-pay | Admitting: Family Medicine

## 2020-07-03 VITALS — BP 122/80 | Temp 97.4°F | Wt 141.0 lb

## 2020-07-03 DIAGNOSIS — Z23 Encounter for immunization: Secondary | ICD-10-CM | POA: Diagnosis not present

## 2020-07-03 DIAGNOSIS — E782 Mixed hyperlipidemia: Secondary | ICD-10-CM | POA: Diagnosis not present

## 2020-07-03 DIAGNOSIS — R1314 Dysphagia, pharyngoesophageal phase: Secondary | ICD-10-CM | POA: Diagnosis not present

## 2020-07-03 DIAGNOSIS — E039 Hypothyroidism, unspecified: Secondary | ICD-10-CM | POA: Diagnosis not present

## 2020-07-03 DIAGNOSIS — Z79899 Other long term (current) drug therapy: Secondary | ICD-10-CM

## 2020-07-03 MED ORDER — LEVOTHYROXINE SODIUM 112 MCG PO TABS
ORAL_TABLET | ORAL | 3 refills | Status: DC
Start: 2020-07-03 — End: 2020-07-12

## 2020-07-03 NOTE — Progress Notes (Signed)
   Subjective:    Patient ID: Jamie Torres, female    DOB: Aug 12, 1956, 64 y.o.   MRN: [id number]  HPI  Patient arrives for a follow up on thyroid.  Patient has thyroid issues Interpreter did not show up but her adult son showed up and interprets for her Patient denies any major issues currently.  States she is feeling well Takes her thyroid medicine regular basis She thinks she takes her anti-inflammatory but she is unsure Her son will verify medicines and then call them back to our office Review of Systems Dysphagia-this occurs intermittently for the patient she is seen ENT previously.  They tried to evaluate her and told her they felt everything looked okay and told her she may need to see a different specialist to help this out she continues to have some dysphagia when she is eating solids but not with liquids No v no nausea    Objective:   Physical Exam Vitals reviewed.  Constitutional:      General: She is not in acute distress. HENT:     Head: Normocephalic and atraumatic.  Eyes:     General:        Right eye: No discharge.        Left eye: No discharge.  Neck:     Trachea: No tracheal deviation.  Cardiovascular:     Rate and Rhythm: Normal rate and regular rhythm.     Heart sounds: Normal heart sounds. No murmur heard.   Pulmonary:     Effort: Pulmonary effort is normal. No respiratory distress.     Breath sounds: Normal breath sounds.  Lymphadenopathy:     Cervical: No cervical adenopathy.  Skin:    General: Skin is warm and dry.  Neurological:     Mental Status: She is alert.     Coordination: Coordination normal.  Psychiatric:        Behavior: Behavior normal.           Assessment & Plan:  1. Hypothyroidism, unspecified type Continue thyroid medicine check labs - TSH - T4, free - Basic metabolic panel  2. Mixed hyperlipidemia Check lab work watch diet stay healthy - Basic metabolic panel - Lipid panel  3. Pharyngoesophageal dysphagia Mild  dysphagia has already seen ENT referral to GI - Ambulatory referral to Gastroenterology Patient may well need to have EGD Also encourage her to get colonoscopy. They can address this at the GI doctor 4. Need for vaccination Vaccine today - Flu Vaccine QUAD High Dose(Fluad)  5. High risk medication use Met 7 today - Basic metabolic panel Follow-up 6 months  Wellness female physical with nurse practitioner recommended

## 2020-07-09 DIAGNOSIS — E039 Hypothyroidism, unspecified: Secondary | ICD-10-CM | POA: Diagnosis not present

## 2020-07-09 DIAGNOSIS — E782 Mixed hyperlipidemia: Secondary | ICD-10-CM | POA: Diagnosis not present

## 2020-07-09 DIAGNOSIS — Z79899 Other long term (current) drug therapy: Secondary | ICD-10-CM | POA: Diagnosis not present

## 2020-07-10 LAB — BASIC METABOLIC PANEL
BUN/Creatinine Ratio: 27 (ref 12–28)
BUN: 20 mg/dL (ref 8–27)
CO2: 21 mmol/L (ref 20–29)
Calcium: 9.1 mg/dL (ref 8.7–10.3)
Chloride: 106 mmol/L (ref 96–106)
Creatinine, Ser: 0.73 mg/dL (ref 0.57–1.00)
GFR calc Af Amer: 100 mL/min/{1.73_m2} (ref >59)
GFR calc non Af Amer: 87 mL/min/{1.73_m2} (ref >59)
Glucose: 100 mg/dL — ABNORMAL HIGH (ref 65–99)
Potassium: 4.6 mmol/L (ref 3.5–5.2)
Sodium: 138 mmol/L (ref 134–144)

## 2020-07-10 LAB — T4, FREE: Free T4: 2.79 ng/dL — ABNORMAL HIGH (ref 0.82–1.77)

## 2020-07-10 LAB — LIPID PANEL
Chol/HDL Ratio: 6.5 ratio — ABNORMAL HIGH (ref 0.0–4.4)
Cholesterol, Total: 215 mg/dL — ABNORMAL HIGH (ref 100–199)
HDL: 33 mg/dL — ABNORMAL LOW (ref >39)
LDL Chol Calc (NIH): 153 mg/dL — ABNORMAL HIGH (ref 0–99)
Triglycerides: 160 mg/dL — ABNORMAL HIGH (ref 0–149)
VLDL Cholesterol Cal: 29 mg/dL (ref 5–40)

## 2020-07-10 LAB — TSH: TSH: 0.066 u[IU]/mL — ABNORMAL LOW (ref 0.450–4.500)

## 2020-07-11 ENCOUNTER — Encounter: Payer: Self-pay | Admitting: Family Medicine

## 2020-07-12 MED ORDER — LEVOTHYROXINE SODIUM 88 MCG PO TABS: 88.0000 ug | ORAL_TABLET | Freq: Every day | ORAL | 0 refills | Status: DC

## 2020-07-12 NOTE — Addendum Note (Signed)
Addended by: Margaretha Sheffield on: 07/12/2020 01:48 PM   Modules accepted: Orders

## 2020-07-26 ENCOUNTER — Ambulatory Visit: Payer: Medicare Other

## 2020-09-05 ENCOUNTER — Encounter: Payer: Self-pay | Admitting: Internal Medicine

## 2020-09-12 ENCOUNTER — Other Ambulatory Visit: Payer: Self-pay | Admitting: Family Medicine

## 2020-09-12 DIAGNOSIS — G4489 Other headache syndrome: Secondary | ICD-10-CM

## 2020-09-20 ENCOUNTER — Other Ambulatory Visit: Payer: Self-pay | Admitting: Family Medicine

## 2020-09-20 DIAGNOSIS — G4489 Other headache syndrome: Secondary | ICD-10-CM

## 2020-10-16 DIAGNOSIS — E039 Hypothyroidism, unspecified: Secondary | ICD-10-CM | POA: Diagnosis not present

## 2020-10-17 LAB — TSH: TSH: 2.98 u[IU]/mL (ref 0.450–4.500)

## 2020-10-17 LAB — T4, FREE: Free T4: 2.18 ng/dL — ABNORMAL HIGH (ref 0.82–1.77)

## 2020-10-18 DIAGNOSIS — R1319 Other dysphagia: Secondary | ICD-10-CM | POA: Diagnosis not present

## 2020-10-18 DIAGNOSIS — K649 Unspecified hemorrhoids: Secondary | ICD-10-CM | POA: Diagnosis not present

## 2020-10-19 ENCOUNTER — Telehealth: Payer: Self-pay

## 2020-10-19 NOTE — Telephone Encounter (Signed)
Pt daughter returning a nurse call from Thursday   Pt call back [phone]

## 2020-10-22 ENCOUNTER — Other Ambulatory Visit: Payer: Self-pay | Admitting: Family Medicine

## 2020-10-22 DIAGNOSIS — E039 Hypothyroidism, unspecified: Secondary | ICD-10-CM

## 2020-10-22 MED ORDER — LEVOTHYROXINE SODIUM 75 MCG PO TABS
ORAL_TABLET | ORAL | 5 refills | Status: DC
Start: 2020-10-22 — End: 2021-04-16

## 2020-10-22 NOTE — Telephone Encounter (Signed)
Daughter contacted regarding lab results. See lab result message

## 2020-10-24 ENCOUNTER — Other Ambulatory Visit: Payer: Self-pay | Admitting: Family Medicine

## 2020-10-24 DIAGNOSIS — G4489 Other headache syndrome: Secondary | ICD-10-CM

## 2020-11-09 ENCOUNTER — Encounter (HOSPITAL_COMMUNITY): Payer: Self-pay | Admitting: Emergency Medicine

## 2020-11-09 ENCOUNTER — Other Ambulatory Visit: Payer: Self-pay

## 2020-11-09 ENCOUNTER — Emergency Department (HOSPITAL_COMMUNITY): Payer: Medicare Other

## 2020-11-09 ENCOUNTER — Emergency Department (HOSPITAL_COMMUNITY)
Admission: EM | Admit: 2020-11-09 | Discharge: 2020-11-09 | Disposition: A | Discharge status: Home / Self Care | Payer: Medicare Other | Attending: Emergency Medicine | Admitting: Emergency Medicine

## 2020-11-09 DIAGNOSIS — Z79899 Other long term (current) drug therapy: Secondary | ICD-10-CM | POA: Insufficient documentation

## 2020-11-09 DIAGNOSIS — E039 Hypothyroidism, unspecified: Secondary | ICD-10-CM | POA: Diagnosis not present

## 2020-11-09 DIAGNOSIS — H9203 Otalgia, bilateral: Secondary | ICD-10-CM | POA: Insufficient documentation

## 2020-11-09 DIAGNOSIS — I10 Essential (primary) hypertension: Secondary | ICD-10-CM | POA: Insufficient documentation

## 2020-11-09 DIAGNOSIS — R519 Headache, unspecified: Secondary | ICD-10-CM | POA: Insufficient documentation

## 2020-11-09 LAB — CBC WITH DIFFERENTIAL/PLATELET
Abs Immature Granulocytes: 0.01 10*3/uL (ref 0.00–0.07)
Basophils Absolute: 0 10*3/uL (ref 0.0–0.1)
Basophils Relative: 1 %
Eosinophils Absolute: 0.1 10*3/uL (ref 0.0–0.5)
Eosinophils Relative: 2 %
HCT: 47.7 % — ABNORMAL HIGH (ref 36.0–46.0)
Hemoglobin: 15.4 g/dL — ABNORMAL HIGH (ref 12.0–15.0)
Immature Granulocytes: 0 %
Lymphocytes Relative: 30 %
Lymphs Abs: 1.6 10*3/uL (ref 0.7–4.0)
MCH: 29 pg (ref 26.0–34.0)
MCHC: 32.3 g/dL (ref 30.0–36.0)
MCV: 89.8 fL (ref 80.0–100.0)
Monocytes Absolute: 0.3 10*3/uL (ref 0.1–1.0)
Monocytes Relative: 6 %
Neutro Abs: 3.2 10*3/uL (ref 1.7–7.7)
Neutrophils Relative %: 61 %
Platelets: 231 10*3/uL (ref 150–400)
RBC: 5.31 MIL/uL — ABNORMAL HIGH (ref 3.87–5.11)
RDW: 14 % (ref 11.5–15.5)
WBC: 5.2 10*3/uL (ref 4.0–10.5)
nRBC: 0 % (ref 0.0–0.2)

## 2020-11-09 LAB — BASIC METABOLIC PANEL
Anion gap: 7 (ref 5–15)
BUN: 15 mg/dL (ref 8–23)
CO2: 24 mmol/L (ref 22–32)
Calcium: 9.3 mg/dL (ref 8.9–10.3)
Chloride: 105 mmol/L (ref 98–111)
Creatinine, Ser: 0.65 mg/dL (ref 0.44–1.00)
GFR, Estimated: >60 mL/min (ref >60)
Glucose, Bld: 122 mg/dL — ABNORMAL HIGH (ref 70–99)
Potassium: 4.2 mmol/L (ref 3.5–5.1)
Sodium: 136 mmol/L (ref 135–145)

## 2020-11-09 LAB — URINALYSIS, ROUTINE W REFLEX MICROSCOPIC
Bilirubin Urine: NEGATIVE
Glucose, UA: NEGATIVE mg/dL
Hgb urine dipstick: NEGATIVE
Ketones, ur: 5 mg/dL — AB
Leukocytes,Ua: NEGATIVE
Nitrite: NEGATIVE
Protein, ur: NEGATIVE mg/dL
Specific Gravity, Urine: 1.004 — ABNORMAL LOW (ref 1.005–1.030)
pH: 7 (ref 5.0–8.0)

## 2020-11-09 MED ORDER — KETOROLAC TROMETHAMINE 15 MG/ML IJ SOLN
15.0000 mg | Freq: Once | INTRAMUSCULAR | Status: AC
  Administered 2020-11-09: 15 mg via INTRAVENOUS
  Filled 2020-11-09: qty 1

## 2020-11-09 MED ORDER — METOCLOPRAMIDE HCL 5 MG/ML IJ SOLN
10.0000 mg | Freq: Once | INTRAMUSCULAR | Status: AC
  Administered 2020-11-09: 10 mg via INTRAVENOUS
  Filled 2020-11-09: qty 2

## 2020-11-09 MED ORDER — LACTATED RINGERS IV BOLUS
1000.0000 mL | Freq: Once | INTRAVENOUS | Status: AC
  Administered 2020-11-09: 1000 mL via INTRAVENOUS

## 2020-11-09 MED ORDER — DIPHENHYDRAMINE HCL 50 MG/ML IJ SOLN
25.0000 mg | Freq: Once | INTRAMUSCULAR | Status: AC
  Administered 2020-11-09: 25 mg via INTRAVENOUS
  Filled 2020-11-09: qty 1

## 2020-11-09 NOTE — ED Triage Notes (Signed)
Patient c/o headache with pressure to forehead and pain in ears bilaterally x1 month. Denies any drainage or hearing loss. Went to see PCP and diagnosed with hypertension and given medication-lisinopril 20mg . Patient also diagnosed with UTI and given antibiotics-nitrofuranoin Macro 100mg . Patient denies any improvement in pain. Denies any sensitivity to light, nausea, vomiting, fevers, dizziness, change in vision, or weakness. Patient does report some sensitivity to sound. Denies taking any other medication for symptoms.

## 2020-11-09 NOTE — Discharge Instructions (Addendum)
If you develop continued, recurrent, or worsening headache, fever, neck stiffness, vomiting, blurry or double vision, weakness or numbness in your arms or legs, trouble speaking, or any other new/concerning symptoms then return to the ER for evaluation.   Si presenta dolor de [name] continuo, recurrente o que [name], fiebre, rigidez en el cuello, vmitos, visin borrosa o doble, debilidad o entumecimiento en los brazos o las piernas, dificultad para hablar o cualquier otro sntoma nuevo o preocupante, regrese a la sala de emergencias para una evaluacin.

## 2020-11-09 NOTE — ED Provider Notes (Addendum)
[facility] EMERGENCY DEPARTMENT Provider Note   CSN: [id number] Arrival date & time: 11/09/20  2841     History Chief Complaint  Patient presents with  . Headache    Jamie Torres is a 65 y.o. female.  HPI 65 year old female presents with headache.  History is taken with the help of the Spanish interpreter.  Patient has been having a headache for quite some time.  However over the last 3 weeks he is also been having bilateral ear pain.  Went to her PCP who gave her medicine for hypertension but does not know why her ears are hurting.  The headache seems to be worse.  It is mostly diffuse.  No vision changes, fever, focal weakness.  No neck pain or stiffness.  Has not been taking anything for the pain.  Recently got over a UTI. Ears still hurt, including the scalp next to the ears.   Past Medical History:  Diagnosis Date  . Arthritis   . Hypercholesteremia   . Hypertension   . Hypothyroidism   . Plantar fasciitis   . TB (pulmonary tuberculosis)   . Thyroid disease     Patient Active Problem List   Diagnosis Date Noted  . Vitamin D deficiency 11/14/2015  . Chronic insomnia 08/26/2014  . Insomnia 07/28/2014  . Mixed hyperlipidemia 05/25/2013  . Plantar fasciitis of right foot 05/25/2013  . Hypothyroidism 09/02/2010  . CONSTIPATION, CHRONIC 09/02/2010  . HEMATOCHEZIA 09/02/2010  . ARTHRITIS 09/02/2010    Past Surgical History:  Procedure Laterality Date  . 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 EYE SURGERY    . TUBAL LIGATION       OB History    Gravida  4   Para  4   Term  4   Preterm      AB      Living  4     SAB      IAB      Ectopic      Multiple      Live Births              History reviewed. No pertinent family history.  Social History   Tobacco Use  . Smoking status: Never Smoker  . Smokeless tobacco: Never Used  Vaping Use  . Vaping Use: Never used  Substance Use Topics  . Alcohol use: No  . Drug use: No    Home  Medications Prior to Admission medications   Medication Sig Start Date End Date Taking? Authorizing Provider  albuterol (PROVENTIL HFA;VENTOLIN HFA) 108 (90 Base) MCG/ACT inhaler Inhale 2 puffs into the lungs every 6 (six) hours as needed for wheezing. 07/26/18   Babs Sciara, MD  diclofenac (VOLTAREN) 75 MG EC tablet TAKE 1 TABLET BY MOUTH TWICE DAILY WITH A MEAL 10/24/20   Luking, Jonna Coup, MD  fish oil-omega-3 fatty acids 1000 MG capsule Take 2 g by mouth daily.    [provider]  levothyroxine (SYNTHROID) 75 MCG tablet Take one tablet po daily 10/22/20   Luking, Semaj Coburn A, MD  tiZANidine (ZANAFLEX) 2 MG tablet TAKE 1 TABLET BY MOUTH TWICE DAILY AS NEEDED FOR MUSCLE SPASMS Patient taking differently: Take 4 mg by mouth at bedtime. Prn neck pain. 02/01/20   Annalee Genta, DO    Allergies    Patient has no known allergies.  Review of Systems   Review of Systems  Constitutional: Negative for fever.  HENT: Positive for ear pain.  Eyes: Negative for visual disturbance.  Musculoskeletal: Negative for neck pain.  Neurological: Positive for headaches. Negative for dizziness, weakness and numbness.  All other systems reviewed and are negative.   Physical Exam Updated Vital Signs BP 125/75   Pulse 76   Temp 98.4 F (36.9 C) (Oral)   Resp 18   Ht 5' (1.524 m)   Wt 64.9 kg   SpO2 97%   BMI 27.93 kg/m   Physical Exam Vitals and nursing note reviewed.  Constitutional:      General: She is not in acute distress.    Appearance: She is well-developed and well-nourished. She is not ill-appearing or diaphoretic.  HENT:     Head: Normocephalic and atraumatic.     Right Ear: Tympanic membrane, ear canal and external ear normal.     Left Ear: Tympanic membrane, ear canal and external ear normal.     Ears:     Comments: There is some mild discomfort to palpation superior to upper ear helix bilaterally    Nose: Nose normal.  Eyes:     General:        Right eye: No discharge.         Left eye: No discharge.     Extraocular Movements: Extraocular movements intact.     Pupils: Pupils are equal, round, and reactive to light.  Cardiovascular:     Rate and Rhythm: Normal rate and regular rhythm.     Heart sounds: Normal heart sounds.  Pulmonary:     Effort: Pulmonary effort is normal.     Breath sounds: Normal breath sounds.  Abdominal:     Palpations: Abdomen is soft.     Tenderness: There is no abdominal tenderness.  Musculoskeletal:     Cervical back: Normal range of motion and neck supple.  Skin:    General: Skin is warm and dry.  Neurological:     Mental Status: She is alert.     Comments: CN 3-12 grossly intact. 5/5 strength in all 4 extremities. Grossly normal sensation. Normal finger to nose.   Psychiatric:        Mood and Affect: Mood is not anxious.     ED Results / Procedures / Treatments   Labs (all labs ordered are listed, but only abnormal results are displayed) Labs Reviewed  CBC WITH DIFFERENTIAL/PLATELET - Abnormal; Notable for the following components:      Result Value   RBC 5.31 (*)    Hemoglobin 15.4 (*)    HCT 47.7 (*)    All other components within normal limits  BASIC METABOLIC PANEL - Abnormal; Notable for the following components:   Glucose, Bld 122 (*)    All other components within normal limits    EKG None  Radiology CT Head Wo Contrast  Result Date: 11/09/2020 CLINICAL DATA:  Headache EXAM: CT HEAD WITHOUT CONTRAST TECHNIQUE: Contiguous axial images were obtained from the base of the skull through the vertex without intravenous contrast. COMPARISON:  October 21, 2012 FINDINGS: Brain: The ventricles and sulci are normal in size and configuration. 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 There is no intracranial mass, hemorrhage, extra-axial fluid collection, or midline shift. Brain parenchyma appears normal. No evident acute infarct. Vascular: No hyperdense vessel.  No evident vascular calcification. Skull: Bony  calvarium appears intact. Sinuses/Orbits: Visualized paranasal sinuses are clear. Visualized orbits appear symmetric bilaterally Other: Mastoid air cells are clear IMPRESSION: There is mild invagination of CSF into the sella, a finding of  questionable clinical significance. This finding has been associated with headaches. Ventricles and sulci normal in size and configuration. Brain parenchyma appears unremarkable.  No mass or hemorrhage. Electronically Signed   By: Bretta Bang III M.D.   On: 11/09/2020 11:06    Procedures Procedures   Medications Ordered in ED Medications  metoCLOPramide (REGLAN) injection 10 mg (10 mg Intravenous Given 11/09/20 1130)  diphenhydrAMINE (BENADRYL) injection 25 mg (25 mg Intravenous Given 11/09/20 1131)  lactated ringers bolus 1,000 mL (1,000 mLs Intravenous New Bag/Given 11/09/20 1130)  ketorolac (TORADOL) 15 MG/ML injection 15 mg (15 mg Intravenous Given 11/09/20 1130)    ED Course  I have reviewed the triage vital signs and the nursing notes.  Pertinent labs & imaging results that were available during my care of the patient were reviewed by me and considered in my medical decision making (see chart for details).    MDM Rules/Calculators/A&P                          Patient has no focal neuro deficits.  Labs have been reviewed and are overall unremarkable.  CT head also without acute pathology.  Questionable CSF findings, though at this point with complete resolution of her headache, visual complaints, and otherwise benign presentation I do not think emergent LP or other CNS work-up is needed.  She has seen a headache specialist in the past and will be referred back to them.  Will refer to ENT for her ear complaints, unclear if this is related to her headache or not.  Otherwise, discussed with family and patient and will discharge home with ibuprofen and Tylenol for headache. Given return precautions.  1:43 PM after being discharged, patient states she  still has dysuria. UA unremarkable. Follow up with PCP. Final Clinical Impression(s) / ED Diagnoses Final diagnoses:  Generalized headache    Rx / DC Orders ED Discharge Orders    None       Pricilla Loveless, MD 11/09/20 1229    Pricilla Loveless, MD 11/09/20 1344

## 2020-11-09 NOTE — ED Notes (Signed)
Patient to be discharge, during discharge instructions-patient reports concern about UTI symptoms not being resolved from prior antibiotics. EDP made aware. Patient to give urine sample and have UA ran before discharge. Patient and family made aware. Patient ambulated to bathroom.

## 2020-11-11 LAB — URINE CULTURE: Culture: <10000 — AB

## 2020-11-13 ENCOUNTER — Other Ambulatory Visit: Payer: Self-pay | Admitting: Family Medicine

## 2020-11-13 ENCOUNTER — Ambulatory Visit (INDEPENDENT_AMBULATORY_CARE_PROVIDER_SITE_OTHER): Payer: Medicare Other | Admitting: Family Medicine

## 2020-11-13 ENCOUNTER — Encounter: Payer: Self-pay | Admitting: Family Medicine

## 2020-11-13 ENCOUNTER — Other Ambulatory Visit: Payer: Self-pay

## 2020-11-13 VITALS — BP 118/70 | HR 86 | Temp 97.9°F | Ht 60.0 in | Wt 141.0 lb

## 2020-11-13 DIAGNOSIS — I10 Essential (primary) hypertension: Secondary | ICD-10-CM

## 2020-11-13 DIAGNOSIS — H2513 Age-related nuclear cataract, bilateral: Secondary | ICD-10-CM | POA: Diagnosis not present

## 2020-11-13 DIAGNOSIS — H40033 Anatomical narrow angle, bilateral: Secondary | ICD-10-CM | POA: Diagnosis not present

## 2020-11-13 MED ORDER — LISINOPRIL 5 MG PO TABS: 5.0000 mg | ORAL_TABLET | Freq: Every day | ORAL | 1 refills | Status: DC

## 2020-11-13 NOTE — Progress Notes (Signed)
Patient ID: Jamie Torres, female    DOB: 12-07-1955, 64 y.o.   MRN: [id number]   Chief Complaint  Patient presents with  . Otalgia   Subjective:  CC: ear pain, high blood pressure  This is a new problem.  Presents today with her son with a complaint of external ear pain, and high blood pressure.  To note: Interpreter in room during visit.  Reports going to [facility] on October 23, 2020 with a complaint of ear pain.  During that visit she was diagnosed with hypertension and urinary tract infection.  She reports completing the antibiotic for the urinary tract infection, and being placed on lisinopril 20 mg daily for her high blood pressure.  Unable to view these records, not sure how high her blood pressure was that day.  She reports that she has headache that goes away and comes back, reports that her husband took her blood pressure yesterday and it was 97/54.  Denies fever, chills, chest pain, shortness of breath, leg swelling, abdominal pain.    pt arrives with son Jamie Torres.  bilateral outer ear pain. Started 2 weeks ago.   Weakness for one week. Started on lisinopril about 3 weeks ago.    Medical History Jamie Torres has a past medical history of Arthritis, Hypercholesteremia, Hypertension, Hypothyroidism, Plantar fasciitis, TB (pulmonary tuberculosis), and Thyroid disease.   Outpatient Encounter Medications as of 11/13/2020  Medication Sig  . diclofenac (VOLTAREN) 75 MG EC tablet TAKE 1 TABLET BY MOUTH TWICE DAILY WITH A MEAL  . fish oil-omega-3 fatty acids 1000 MG capsule Take 2 g by mouth daily.  Marland Kitchen levothyroxine (SYNTHROID) 75 MCG tablet Take one tablet po daily  . lisinopril (ZESTRIL) 5 MG tablet Take 1 tablet (5 mg total) by mouth daily.  . [DISCONTINUED] lisinopril (ZESTRIL) 20 MG tablet Take 20 mg by mouth daily.  . [DISCONTINUED] tiZANidine (ZANAFLEX) 2 MG tablet TAKE 1 TABLET BY MOUTH TWICE DAILY AS NEEDED FOR MUSCLE SPASMS (Patient taking differently: Take 4 mg by mouth  at bedtime. Prn neck pain.)  . [DISCONTINUED] albuterol (PROVENTIL HFA;VENTOLIN HFA) 108 (90 Base) MCG/ACT inhaler Inhale 2 puffs into the lungs every 6 (six) hours as needed for wheezing. (Patient not taking: Reported on 11/13/2020)   No facility-administered encounter medications on file as of 11/13/2020.     Review of Systems  Constitutional: Negative for chills and fever.  HENT: Negative for congestion and ear pain.        Tinnitus. No pain.   Eyes: Negative for visual disturbance.  Respiratory: Negative for cough, chest tightness and shortness of breath.   Cardiovascular: Negative for chest pain and leg swelling.  Gastrointestinal: Negative for abdominal pain.  Genitourinary: Negative for dysuria.       Recently diagnosed with UTI, took medication  Musculoskeletal: Negative for back pain.  Neurological: Positive for dizziness and headaches. Negative for light-headedness.     Vitals BP 118/70   Pulse 86   Temp 97.9 F (36.6 C)   Ht 5' (1.524 m)   Wt 141 lb (64 kg)   SpO2 99%   BMI 27.54 kg/m   Objective:   Physical Exam Vitals reviewed. Exam conducted with a chaperone present.  Constitutional:      Appearance: Normal appearance.  Cardiovascular:     Rate and Rhythm: Normal rate and regular rhythm.     Heart sounds: Normal heart sounds.  Pulmonary:     Effort: Pulmonary effort is normal.     Breath sounds:  Normal breath sounds.  Skin:    General: Skin is warm and dry.  Neurological:     General: No focal deficit present.     Mental Status: She is alert.  Psychiatric:        Behavior: Behavior normal.   Interpreter in room throughout visit.   Assessment and Plan   1. Essential hypertension - lisinopril (ZESTRIL) 5 MG tablet; Take 1 tablet (5 mg total) by mouth daily.  Dispense: 30 tablet; Refill: 1   Concerned that 20 mg dose too high, experiencing hypotension.  Reports blood pressure yesterday was 97/54.  Will discontinue lisinopril 20 mg, start lisinopril 5  mg with blood pressure monitoring at home.  She was instructed to take her blood pressure a few times per week, different times a day, and to keep a blood pressure log.  Agrees with plan of care discussed today. Understands warning signs to seek further care: chest pain, shortness of breath, any significant change in health.  Understands to follow-up in 1 month for reevaluation of blood pressure.  She is instructed to bring her blood pressure readings at that 1 month follow-up visit.  Interpreter in room throughout visit, son updated at the end.  He reports that they have an appointment with the ENT on January 24 for the external ear pain that she is experiencing.     Jamie Bodo, NP 11/13/2020

## 2020-11-13 NOTE — Patient Instructions (Signed)
Plan de alimentacin DASH DASH Eating Plan DASH es la sigla en ingls de "Enfoques Alimentarios para Detener la Hipertensin". El plan de alimentacin DASH ha demostrado:  Bajar la presin arterial elevada (hipertensin).  Reducir el riesgo de diabetes tipo 2, enfermedad cardaca y accidente cerebrovascular.  Ayudar a perder peso. Consejos para seguir Consulting civil engineer las etiquetas de los alimentos  Verifique la cantidad de sal (sodio) por porcin en las etiquetas de los alimentos. Elija alimentos con menos del 5 por ciento del valor diario de sodio. Generalmente, los alimentos con menos de 300 miligramos (mg) de sodio por porcin se encuadran dentro de este plan alimentario.  Para encontrar cereales integrales, busque la palabra "integral" como primera palabra en la lista de ingredientes. Al ir de compras  Compre productos en los que en su etiqueta diga: "bajo contenido de sodio" o "sin agregado de sal".  Compre alimentos frescos. Evite los alimentos enlatados y comidas precocidas o congeladas. Al cocinar  Evite agregar sal cuando cocine. Use hierbas o aderezos sin sal, en lugar de sal de mesa o sal marina. Consulte al mdico o farmacutico antes de usar sustitutos de la sal.  No fra los alimentos. A la hora de cocinar los alimentos opte por hornearlos, hervirlos, grillarlos, asarlos al horno y asarlos a Patent attorney.  Cocine con aceites cardiosaludables, como oliva, canola, aguacate, soja o girasol. Planificacin de las comidas  Consuma una dieta equilibrada, que incluya lo siguiente: ? 4o ms porciones de frutas y 4 o ms porciones de Warehouse manager. Trate de que medio plato de cada comida sea de frutas y verduras. ? De 6 a 8porciones de cereales [employer]. ? Menos de 6 onzas (170g) de carne, aves o pescado Copy. Una porcin de 3 onzas (85g) de carne tiene casi el mismo tamao que un mazo de cartas. Un huevo equivale a 1 onza (28g). ? De 2 a 3  porciones de productos lcteos descremados por da. Una porcin es 1taza ( ). ? 1 porcin de frutos secos, semillas o frijoles 5 veces por semana. ? De 2 a 3 porciones de grasas cardiosaludables. Las grasas saludables llamadas cidos grasos omega-3 se encuentran en alimentos como las nueces, las semillas de [place], las leches fortificadas y [place]. Estas grasas tambin se encuentran en los pescados de agua fra, como la sardina, el salmn y la caballa.  Limite la cantidad que consume de: ? Alimentos enlatados o envasados. ? Alimentos con alto contenido de grasa trans, como algunos alimentos fritos. ? Alimentos con alto contenido de grasa saturada, como carne con grasa. ? Postres y otros dulces, bebidas azucaradas y otros alimentos con azcar agregada. ? Productos lcteos enteros.  No le agregue sal a los alimentos antes de probarlos.  No coma ms de 4 yemas de huevo por semana.  Trate de comer al menos 2 comidas vegetarianas por semana.  Consuma ms comida casera y menos de restaurante, de bares y comida rpida.   Estilo de vida  Cuando coma en un restaurante, pida que preparen su comida con menos sal o, en lo posible, sin nada de sal.  Si bebe alcohol: ? Limite la cantidad que bebe:  De 0 a 1 medida por da para las mujeres que no estn embarazadas.  De 0 a 2 medidas por da para los hombres. ? Est atento a la cantidad de alcohol que hay en las bebidas que toma. En los [address], una medida equivale a una botella de Financial controller  de 12oz ( ), un vaso de vino de 5oz ( ) o un vaso de una bebida alcohlica de alta graduacin de 1oz (14ml). Informacin general  Evite ingerir ms de 2300 mg de sal por da. Si tiene hipertensin, es posible que necesite reducir la ingesta de sodio a 1,500 mg por da.  Trabaje con su mdico para mantener un peso saludable o perder [employer]. Pregntele cul es el peso recomendado para usted.  Realice al menos 30 minutos de ejercicio que  haga que se acelere su corazn (ejercicio Magazine features editor) la [employer]V de la [place]. Estas actividades pueden incluir caminar, nadar o andar en bicicleta.  Trabaje con su mdico o nutricionista para ajustar su plan alimentario a sus necesidades calricas personales. Qu alimentos debo comer? Frutas Todas las frutas frescas, congeladas o disecadas. Frutas enlatadas en jugo natural (sin agregado de azcar). Verduras Verduras frescas o congeladas (crudas, al vapor, asadas o grilladas). Jugos de tomate y verduras con bajo contenido de sodio o reducidos en sodio. Salsa y pasta de tomate con bajo contenido de sodio o reducidas en sodio. Verduras enlatadas con bajo contenido de sodio o reducidas en sodio. Granos Pan de salvado o integral. Pasta de salvado o integral. Arroz integral. Avena. Quinua. Trigo burgol. Cereales integrales y con bajo contenido de [place]. Pan pita. Galletitas de [place] con bajo contenido de [place] (the [place] of 60 deg [place]) y [place]. Tortillas de [place] integral. Carnes y otras protenas Pollo o pavo sin piel. Carne de pollo o de [place]. Cerdo desgrasado. Pescado y [employer]. Claras de huevo. Porotos, guisantes o lentejas secos. Frutos secos, mantequilla de frutos secos y semillas sin sal. Frijoles enlatados sin sal. Cortes de carne vacuna magra, desgrasada. Carne precocida o curada magra y baja en sodio, como embutidos o panes de carne. Lcteos Leche descremada (1%) o descremada. Quesos reducidos en grasa, con bajo contenido de grasa o descremados. Queso blanco o ricota sin grasa, con bajo contenido de [place]. Yogur semidescremado o descremado. Queso con bajo contenido de [place] of [place]) y [place]. Grasas y [employer] untables que no contengan grasas trans. Aceite vegetal. Jerolyn Shin y aderezos para ensaladas livianos, reducidos en grasa o con bajo contenido de grasas (reducidos en sodio). Aceite de canola, crtamo, oliva, aguacate, soja y [place]. [place]. Alios y condimentos Hierbas. Especias. Mezclas de  condimentos sin sal. Otros alimentos Palomitas de maz y pretzels sin sal. Dulces con bajo contenido de grasas. Es posible que los productos que se enumeran ms [place] no constituyan una lista completa de los alimentos y las bebidas que puede tomar. Consulte a un nutricionista para obtener ms informacin. Qu alimentos debo evitar? Nils Pyle Fruta enlatada en almbar liviano o espeso. Frutas cocidas en aceite. Frutas con salsa de crema o mantequilla. Verduras Verduras con crema o fritas. Verduras en salsa de [place]. Verduras enlatadas regulares (que no sean con bajo contenido de sodio o reducidas en sodio). Pasta y salsa de tomates enlatadas regulares (que no sean con bajo contenido de sodio o reducidas en sodio). Jugos de tomate y verduras regulares (que no sean con bajo contenido de sodio o reducidos en sodio). Pepinillos. Aceitunas. Granos Productos de panificacin hechos con grasa, como medialunas, magdalenas y algunos panes. Comidas con arroz o pasta seca listas para usar. Carnes y [address] de carne con alto contenido de Holiday representative. Costillas. Carne frita. [place]. [place], salame y otras carnes precocidas o curadas, como embutidos o panes de carne. Grasa de la espalda del cerdo (panceta). Salchicha de cerdo. Frutos secos y semillas con sal. Frijoles enlatados con  agregado de sal. Pescado enlatado o ahumado. Huevos enteros o yemas. Pollo o pavo con piel. Lcteos Leche entera o al 2%, crema y [address] y mitad crema. Queso crema entero o con toda su grasa. Yogur entero o endulzado. Quesos con toda su grasa. Sustitutos de cremas no lcteas. Coberturas batidas. Quesos para untar y quesos procesados. Grasas y [employer]. Margarina en barra. Manteca de cerdo. Lardo. Mantequilla clarificada. Grasa de panceta. Aceites tropicales como aceite de coco, palmiste o palma. Alios y condimentos Sal de cebolla, sal de ajo, sal condimentada, sal de mesa y sal marina. Salsa [place].  Salsa trtara. Salsa barbacoa. Salsa teriyaki. Salsa de soja, incluso la que tiene contenido reducido de [place]. Salsa de carne. Salsas en lata y envasadas. Salsa de pescado. Salsa de [place]. Salsa rosada. Rbanos picantes comprados en tiendas. [place]. [place]aza. Saborizantes y tiernizantes para carne. Caldo en cubitos. Salsas picantes. Adobos preelaborados o envasados. Aderezos para tacos preelaborados o envasados. Salsas de pepinillos. Aderezos comunes para ensalada. Otros alimentos Palomitas de maz y pretzels con sal. Es posible que los productos que se enumeran ms arriba no constituyan una lista completa de los alimentos y las bebidas que Personnel officer. Consulte a un nutricionista para obtener ms informacin. Dnde buscar ms informacin  National Heart, Lung, and Blood Institute (Instituto Nacional del [place], los Pulmones y Risk manager): PopSteam.is  American Heart Association (Asociacin Estadounidense del Corazn): www.heart.org  Academy of Nutrition and Dietetics (Academia de Nutricin y Pension scheme manager): www.eatright.org  National Kidney Foundation (Fundacin Nacional del Rin): www.kidney.org Resumen  El plan de alimentacin DASH ha demostrado bajar la presin arterial elevada (hipertensin). Tambin puede reducir [employer] de diabetes tipo 2, enfermedad cardaca y accidente cerebrovascular.  Cuando siga el plan de alimentacin DASH, trate de comer ms frutas frescas y verduras, cereales integrales, carnes magras, lcteos descremados y grasas cardiosaludables.  Con el plan de alimentacin DASH, deber limitar el consumo de sal (sodio) a 2,300 mg por da. Si tiene hipertensin, es posible que necesite reducir la ingesta de sodio a 1,500 mg por da.  Trabaje con su mdico o nutricionista para ajustar su plan alimentario a sus necesidades calricas personales. Esta informacin no tiene Theme park manager el consejo del mdico. Asegrese de hacerle al mdico cualquier pregunta que  tenga. Document Revised: 11/03/2019 Document Reviewed: 11/03/2019 Elsevier Patient Education  2021 Elsevier Inc. Hipertensin en los adultos Hypertension, Adult La presin arterial alta (hipertensin) se produce cuando la fuerza de la sangre bombea a travs de las arterias con mucha fuerza. Las arterias son los vasos sanguneos que transportan la sangre desde el corazn al resto del cuerpo. La hipertensin hace que el corazn haga ms esfuerzo para Insurance account manager y Sears Holdings Corporation que las arterias se Armed forces training and education officer o Multimedia programmer. La hipertensin no tratada o no controlada puede causar infarto de miocardio, insuficiencia cardaca, accidente cerebrovascular, enfermedad renal y otros problemas. Una lectura de la presin arterial consta de un nmero ms alto sobre un nmero ms bajo. En condiciones ideales, la presin arterial debe estar por debajo de 120/80. El primer nmero ("superior") es la presin sistlica. Es la medida de la presin de las arterias cuando el corazn late. El segundo nmero ("inferior") es la presin diastlica. Es la medida de la presin en las arterias cuando el corazn se relaja. Cules son las causas? Se desconoce la causa exacta de esta afeccin. Hay algunas afecciones que causan presin arterial alta o estn relacionadas con ella. Qu incrementa el riesgo? Algunos factores de riesgo de hipertensin estn  bajo su control. Los siguientes factores pueden hacer que sea ms propenso a Aeronautical engineer afeccin:  Fumar.  Tener diabetes mellitus tipo 2, colesterol alto, o ambos.  No hacer la cantidad suficiente de actividad fsica o ejercicio.  Tener sobrepeso.  Consumir mucha grasa, azcar, caloras o sal (sodio) en su dieta.  Beber alcohol en exceso. Algunos factores de riesgo para la presin arterial alta pueden ser difciles o imposibles de Multimedia programmer. Algunos de estos factores son los siguientes:  Tener enfermedad renal crnica.  Tener antecedentes familiares de presin  arterial alta.  Edad. Los riesgos aumentan con la edad.  Raza. El riesgo es mayor para las Statistician.  Sexo. Antes de los 45aos, los hombres corren ms [employer]. Despus de los 65aos, las mujeres corren ms [employer].  Tener apnea obstructiva del sueo.  Estrs. Cules son los signos o los sntomas? Es posible que la presin arterial alta puede no cause sntomas. La presin arterial muy alta (crisis hipertensiva) puede provocar:  Dolor de cabeza.  Ansiedad.  Falta de aire.  Hemorragia nasal.  Nuseas y vmitos.  Cambios en la visin.  Dolor de pecho intenso.  Convulsiones. Cmo se diagnostica? Esta afeccin se diagnostica al medir su presin arterial mientras se encuentra sentado, con el brazo apoyado sobre una superficie plana, las piernas sin cruzar y los pies bien apoyados en el piso. El brazalete del tensimetro debe colocarse directamente sobre la piel de la parte superior del brazo y al nivel de su corazn. Debe medirla al [facility] veces en el mismo brazo. Determinadas condiciones pueden causar una diferencia de presin arterial entre el brazo izquierdo y Aeronautical engineer. Ciertos factores pueden provocar que las lecturas de la presin arterial sean inferiores o superiores a lo normal por un perodo corto de tiempo:  Si su presin arterial es ms alta cuando se encuentra en el consultorio del mdico que cuando la mide en su hogar, se denomina "hipertensin de bata blanca". La Harley[employer]Davidson de las personas que tienen esta afeccin no deben ser [employer]el[employer].  Si su presin arterial es ms alta en el hogar que cuando se encuentra en el consultorio del mdico, se denomina "hipertensin enmascarada". La Harley-Davidson de las personas que tienen esta afeccin deben ser medicadas para Chief Operating Officer la presin arterial. Si tiene una lecturas de presin arterial alta durante una visita o si tiene presin arterial normal con otros factores de riesgo, se le podr  pedir que haga lo siguiente:  Que regrese otro da para volver a Chief Operating Officer su presin arterial nuevamente.  Que se controle la presin arterial en su casa durante 1 semana o ms. Si se le diagnostica hipertensin, es posible que se le realicen otros anlisis de sangre o estudios de diagnstico por imgenes para ayudar a su mdico a comprender su riesgo general de tener otras afecciones. Cmo se trata? Esta afeccin se trata haciendo cambios saludables en el estilo de vida, tales como ingerir alimentos saludables, realizar ms ejercicio y reducir el consumo de alcohol. El mdico puede recetarle medicamentos si los cambios en el estilo de vida no son suficientes para Museum/gallery curator la presin arterial y si:  Su presin arterial sistlica est por encima de 130.  Su presin arterial diastlica est por encima de 80. La presin arterial deseada puede variar en funcin de las enfermedades, la edad y otros factores personales. Siga estas instrucciones en su casa: Comida y bebida  Siga una dieta con alto contenido de fibras y [place], y  con bajo contenido de sodio, azcar agregada y grasas. Un ejemplo de plan alimenticio es la dieta DASH (Dietary Approaches to Stop Hypertension, Mtodos alimenticios para detener la hipertensin). Para alimentarse de esta manera: ? Coma mucha fruta y verdura fresca. Trate de que la mitad del plato de cada comida sea de frutas y verduras. ? Coma cereales integrales, como pasta integral, arroz integral o pan integral. Llene aproximadamente un cuarto del plato con cereales integrales. ? Coma y beba productos lcteos con bajo contenido de grasa, como leche descremada o yogur bajo en grasas. ? Evite la ingesta de cortes de carne grasa, carne procesada o curada, y carne de ave con piel. Llene aproximadamente un cuarto del plato con protenas magras, como pescado, pollo sin piel, frijoles, huevos o tofu. ? Evite ingerir alimentos prehechos y procesados. En general, estos  tienen mayor cantidad de sodio, azcar agregada y Steffanie Rainwatergrasa.  Reduzca su ingesta diaria de sodio. La mayora de las personas que tienen hipertensin deben comer menos de 1500 mg de sodio por C.H. Robinson Worldwideda.  No beba alcohol si: ? Su mdico le indica no hacerlo. ? Est embarazada, puede estar embarazada o est tratando de quedar embarazada.  Si bebe alcohol: ? Limite la cantidad que bebe a lo siguiente:  De 0 a 1 medida por da para las mujeres.  De 0 a 2 medidas por da para los hombres. ? Est atento a la cantidad de alcohol que hay en las bebidas que toma. En los [place], una medida equivale a una botella de cerveza de 12oz (355ml), un vaso de vino de 5oz (148ml) o un vaso de una bebida alcohlica de alta graduacin de 1oz (44ml).   Estilo de vida  Trabaje con su mdico para mantener un peso saludable o Curatorperder peso. Pregntele cul es el peso recomendado para usted.  Haga al menos 30minutos de ejercicio la DIRECTVmayora de los das de la [place] Centersemana. Estas actividades pueden incluir caminar, nadar o andar en bicicleta.  Incluya ejercicios para fortalecer sus msculos (ejercicios de resistencia), como [place] o levantamiento de pesas, como parte de su rutina semanal de ejercicios. Intente realizar 30minutos de este tipo de ejercicios al Kelloggmenos tres das a la Coinsemana.  No consuma ningn producto que contenga nicotina o tabaco, como cigarrillos, cigarrillos electrnicos y tabaco de Theatre managermascar. Si necesita ayuda para dejar de fumar, consulte al mdico.  Contrlese la presin arterial en su casa segn las indicaciones del mdico.  Concurra a todas las visitas de seguimiento como se lo haya indicado el mdico. Esto es importante.   Medicamentos  Baxter Internationalome los medicamentos de venta libre y los recetados solamente como se lo haya indicado el mdico. Siga cuidadosamente las indicaciones. Los medicamentos para la presin arterial deben tomarse segn las indicaciones.  No omita las dosis de medicamentos para la  presin arterial. Si lo hace, estar en riesgo de tener problemas y puede hacer que los medicamentos sean menos eficaces.  Pregntele a su mdico a qu efectos secundarios o reacciones a los Museum/gallery curatormedicamentos debe prestar atencin. Comunquese con un mdico si:  Piensa que tiene una reaccin a un medicamento que est tomando.  Tiene dolores de cabeza frecuentes (recurrentes).  Se siente mareado.  Tiene hinchazn en los tobillos.  Tiene problemas de visin. Solicite ayuda inmediatamente si:  Siente un dolor de cabeza intenso o confusin.  Siente debilidad inusual o adormecimiento.  Siente que va a desmayarse.  Siente un dolor intenso en el pecho o el abdomen.  Vomita repetidas veces.  Tiene  dificultad para respirar. Resumen  La hipertensin se produce cuando la sangre bombea en las arterias con mucha fuerza. Si esta afeccin no se controla, podra correr riesgo de tener complicaciones graves.  La presin arterial deseada puede variar en funcin de las enfermedades, la edad y otros factores personales. Para la [employer], una presin arterial normal es menor que 120/80.  La hipertensin se trata con cambios en el estilo de vida, medicamentos o una combinacin de [place]. Los [employer] estilo de vida incluyen prdida de peso, ingerir alimentos sanos, seguir una dieta baja en sodio, hacer ms ejercicio y Glass blower/designer consumo de alcohol. Esta informacin no tiene Theme park manager el consejo del mdico. Asegrese de hacerle al mdico cualquier pregunta que tenga. Document Revised: 07/15/2018 Document Reviewed: 07/15/2018 Elsevier Patient Education  2021 [employer].

## 2020-11-17 DIAGNOSIS — R7309 Other abnormal glucose: Secondary | ICD-10-CM | POA: Diagnosis not present

## 2020-11-17 DIAGNOSIS — E785 Hyperlipidemia, unspecified: Secondary | ICD-10-CM | POA: Diagnosis not present

## 2020-11-17 DIAGNOSIS — R5383 Other fatigue: Secondary | ICD-10-CM | POA: Diagnosis not present

## 2020-11-17 DIAGNOSIS — Z0000 Encounter for general adult medical examination without abnormal findings: Secondary | ICD-10-CM | POA: Diagnosis not present

## 2020-11-22 DIAGNOSIS — Z01812 Encounter for preprocedural laboratory examination: Secondary | ICD-10-CM | POA: Diagnosis not present

## 2020-11-23 DIAGNOSIS — R519 Headache, unspecified: Secondary | ICD-10-CM | POA: Diagnosis not present

## 2020-11-27 DIAGNOSIS — K648 Other hemorrhoids: Secondary | ICD-10-CM | POA: Diagnosis not present

## 2020-11-27 DIAGNOSIS — K2930 Chronic superficial gastritis without bleeding: Secondary | ICD-10-CM | POA: Diagnosis not present

## 2020-11-27 DIAGNOSIS — K5730 Diverticulosis of large intestine without perforation or abscess without bleeding: Secondary | ICD-10-CM | POA: Diagnosis not present

## 2020-11-27 DIAGNOSIS — K219 Gastro-esophageal reflux disease without esophagitis: Secondary | ICD-10-CM | POA: Diagnosis not present

## 2020-11-27 DIAGNOSIS — Z1211 Encounter for screening for malignant neoplasm of colon: Secondary | ICD-10-CM | POA: Diagnosis not present

## 2020-11-27 DIAGNOSIS — K3189 Other diseases of stomach and duodenum: Secondary | ICD-10-CM | POA: Diagnosis not present

## 2020-11-27 DIAGNOSIS — K222 Esophageal obstruction: Secondary | ICD-10-CM | POA: Diagnosis not present

## 2020-11-27 DIAGNOSIS — K635 Polyp of colon: Secondary | ICD-10-CM | POA: Diagnosis not present

## 2020-11-27 DIAGNOSIS — R1310 Dysphagia, unspecified: Secondary | ICD-10-CM | POA: Diagnosis not present

## 2020-11-30 ENCOUNTER — Encounter (INDEPENDENT_AMBULATORY_CARE_PROVIDER_SITE_OTHER): Payer: Self-pay | Admitting: Otolaryngology

## 2020-11-30 ENCOUNTER — Ambulatory Visit (INDEPENDENT_AMBULATORY_CARE_PROVIDER_SITE_OTHER): Payer: Medicare Other | Admitting: Otolaryngology

## 2020-11-30 ENCOUNTER — Other Ambulatory Visit: Payer: Self-pay

## 2020-11-30 VITALS — Temp 97.9°F

## 2020-11-30 DIAGNOSIS — M26609 Unspecified temporomandibular joint disorder, unspecified side: Secondary | ICD-10-CM | POA: Diagnosis not present

## 2020-11-30 DIAGNOSIS — H903 Sensorineural hearing loss, bilateral: Secondary | ICD-10-CM

## 2020-11-30 DIAGNOSIS — H6123 Impacted cerumen, bilateral: Secondary | ICD-10-CM | POA: Diagnosis not present

## 2020-11-30 DIAGNOSIS — H9313 Tinnitus, bilateral: Secondary | ICD-10-CM

## 2020-11-30 NOTE — Progress Notes (Signed)
HPI: Jamie Torres is a 65 y.o. female who returns today for evaluation of ear complaints.  She has had some pain discomfort around both ears but worse on the left side.  She also describes "tinnitus" in the left ear.  She has not noted any hearing problems.  She presents with her daughter today who acts as her translator as she does not understand [place] well.  She has had no drainage from the ears..  She also describes pain on top of the ears where her glasses sit.  Past Medical History:  Diagnosis Date  . Arthritis   . Hypercholesteremia   . Hypertension   . Hypothyroidism   . Plantar fasciitis   . TB (pulmonary tuberculosis)   . Thyroid disease    Past Surgical History:  Procedure Laterality Date  . 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 EYE SURGERY    . TUBAL LIGATION     Social History   Socioeconomic History  . Marital status: Married    Spouse name: Not on file  . Number of children: Not on file  . Years of education: Not on file  . Highest education level: Not on file  Occupational History  . Not on file  Tobacco Use  . Smoking status: Never Smoker  . Smokeless tobacco: Never Used  Vaping Use  . Vaping Use: Never used  Substance and Sexual Activity  . Alcohol use: No  . Drug use: No  . Sexual activity: Not on file  Other Topics Concern  . Not on file  Social History Narrative  . Not on file   Social Determinants of Health   Financial Resource Strain: Not on file  Food Insecurity: Not on file  Transportation Needs: Not on file  Physical Activity: Not on file  Stress: Not on file  Social Connections: Not on file   No family history on file. No Known Allergies Prior to Admission medications   Medication Sig Start Date End Date Taking? Authorizing Provider  diclofenac (VOLTAREN) 75 MG EC tablet TAKE 1 TABLET BY MOUTH TWICE DAILY WITH A MEAL 10/24/20   Luking, Jonna Coup, MD  fish oil-omega-3 fatty acids 1000 MG capsule Take 2 g by mouth daily.    [provider]  levothyroxine (SYNTHROID) 75 MCG tablet Take one tablet po daily 10/22/20   Luking, Scott A, MD  lisinopril (ZESTRIL) 5 MG tablet Take 1 tablet (5 mg total) by mouth daily. 11/13/20   Novella Olive, NP     Positive ROS: Otherwise negative  All other systems have been reviewed and were otherwise negative with the exception of those mentioned in the HPI and as above.  Physical Exam: Constitutional: Alert, well-appearing, no acute distress Ears: External ears without lesions or tenderness.  Ear canals with a minimal amount of wax and some hairs in both ear canals that was removed with forceps.  There is no inflammatory changes of the ear canals.  Both TMs are clear with good mobility on pneumatic otoscopy.  On tuning fork testing she has diminished hearing in both ears with a 1024 tuning fork. Nasal: External nose without lesions.. Clear nasal passages Oral: Lips and gums without lesions. Tongue and palate mucosa without lesions. Posterior oropharynx clear. Palpation of the TMJ joints reveals severe TMJ dysfunction bilaterally but worse on the left side. Neck: No palpable adenopathy or masses.  Ears are normal to examination concerning the skin and mastoid areas bilaterally. Respiratory: Breathing comfortably  Skin:  No facial/neck lesions or rash noted.  Cerumen impaction removal  Date/Time: 11/30/2020 2:35 PM Performed by: Drema Halon, MD Authorized by: Drema Halon, MD   Consent:    Consent obtained:  Verbal   Consent given by:  Patient   Risks discussed:  Pain and bleeding Procedure details:    Location:  L ear and R ear   Procedure type: forceps   Post-procedure details:    Inspection:  TM intact and canal normal   Hearing quality:  Improved   Patient tolerance of procedure:  Tolerated well, no immediate complications Comments:     TMs are clear bilaterally.    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  I discussed this with the daughter. The pain in and around her ears I suspect is related to TMJ dysfunction.  Recommended follow-up with her dentist concerning further treatment of this.  And reviewed this with her daughter.  Plan: Offered to get a hearing test in the office today but they will not interested in a hearing test at this point.  Reviewed with the daughter concerning the tinnitus as well as the TMJ dysfunction causing the ear pain.  Reassured them of no primary ear abnormality.   Narda Bonds, MD thanks ear complaints

## 2020-12-04 DIAGNOSIS — K2930 Chronic superficial gastritis without bleeding: Secondary | ICD-10-CM | POA: Diagnosis not present

## 2020-12-04 DIAGNOSIS — K635 Polyp of colon: Secondary | ICD-10-CM | POA: Diagnosis not present

## 2020-12-04 DIAGNOSIS — K219 Gastro-esophageal reflux disease without esophagitis: Secondary | ICD-10-CM | POA: Diagnosis not present

## 2020-12-11 ENCOUNTER — Encounter: Payer: Self-pay | Admitting: Family Medicine

## 2020-12-11 ENCOUNTER — Encounter: Payer: Medicare Other | Admitting: Family Medicine

## 2020-12-17 ENCOUNTER — Ambulatory Visit (INDEPENDENT_AMBULATORY_CARE_PROVIDER_SITE_OTHER): Payer: Medicare Other | Admitting: Family Medicine

## 2020-12-17 ENCOUNTER — Encounter: Payer: Self-pay | Admitting: Family Medicine

## 2020-12-17 ENCOUNTER — Other Ambulatory Visit: Payer: Self-pay

## 2020-12-17 VITALS — BP 118/73 | HR 60 | Temp 97.0°F | Wt 141.6 lb

## 2020-12-17 DIAGNOSIS — I10 Essential (primary) hypertension: Secondary | ICD-10-CM

## 2020-12-17 MED ORDER — LISINOPRIL 5 MG PO TABS: 5.0000 mg | ORAL_TABLET | Freq: Every day | ORAL | 0 refills | Status: DC

## 2020-12-17 NOTE — Progress Notes (Signed)
Pt here to recheck blood pressure. Pt has kept log off blood pressure. No issues with blood pressure.    Patient ID: Jamie Torres, female    DOB: 1956-05-22, 65 y.o.   MRN: [id number]   Chief Complaint  Patient presents with  . Hypertension   Subjective:  CC: follow-up for blood pressure   This is not a new problem.  Presents today to follow-up on hypertension.  Originally seen at an urgent care, started on lisinopril, followed up at this office where dose was changed.  Brought a blood pressure log today, blood pressures range 120s to 130s over 70s.  Blood pressure is well controlled today in the office.  Presents today with her son, will use interpreter services for this visit.  Through the interpreter, denies fever, chills, chest pain, shortness of breath leg swelling vision changes and headaches.  Reports that she is tolerating the medication well with no obvious side effects.    Medical History Jamie Torres has a past medical history of Arthritis, Hypercholesteremia, Hypertension, Hypothyroidism, Plantar fasciitis, TB (pulmonary tuberculosis), and Thyroid disease.   Outpatient Encounter Medications as of 12/17/2020  Medication Sig  . diclofenac (VOLTAREN) 75 MG EC tablet TAKE 1 TABLET BY MOUTH TWICE DAILY WITH A MEAL  . fish oil-omega-3 fatty acids 1000 MG capsule Take 2 g by mouth daily.  Marland Kitchen levothyroxine (SYNTHROID) 75 MCG tablet Take one tablet po daily  . [DISCONTINUED] lisinopril (ZESTRIL) 5 MG tablet Take 1 tablet (5 mg total) by mouth daily.  Marland Kitchen lisinopril (ZESTRIL) 5 MG tablet Take 1 tablet (5 mg total) by mouth daily.   No facility-administered encounter medications on file as of 12/17/2020.     Review of Systems  Constitutional: Negative for chills and fever.  Eyes: Negative for visual disturbance.  Respiratory: Negative for shortness of breath.   Cardiovascular: Negative for chest pain and leg swelling.  Neurological: Negative for headaches.     Vitals BP 118/73   Pulse  60   Temp (!) 97 F (36.1 C)   Wt 141 lb 9.6 oz (64.2 kg)   SpO2 99%   BMI 27.65 kg/m   Objective:   Physical Exam Vitals and nursing note reviewed.  Constitutional:      General: She is not in acute distress. Pulmonary:     Effort: Pulmonary effort is normal.  Skin:    General: Skin is warm and dry.  Neurological:     General: No focal deficit present.     Mental Status: She is alert. Mental status is at baseline.  Psychiatric:        Behavior: Behavior normal.      Assessment and Plan   1. Essential hypertension - lisinopril (ZESTRIL) 5 MG tablet; Take 1 tablet (5 mg total) by mouth daily.  Dispense: 90 tablet; Refill: 0   Interpreter per [employer]. Blood pressure well controlled.   Hypertension Medication compliance: taking lisinopril as prescribed.  Denies chest pain, shortness of breath, lower extremity edema, vision changes, headaches.  Pertinent lab work: last 11/09/20.  ASCVD 10- year risk score: not at this visit.  Monitoring: continue to take blood pressure a few times per week Side effects: none Continue current medication regimen: lisinopril 5 mg daily.  Refill sent for 3 months.  Discussed TMJ condition, saw ENT, recommended follow-up with dentist for this condition.  Agrees with plan of care discussed today. Understands warning signs to seek further care: chest pain, shortness of breath, any significant change in health.  Understands to follow-up in 3 months, if blood pressure remains stable, will then change to 70-month medication management follow-up.    Novella Olive, NP 12/17/2020

## 2020-12-17 NOTE — Patient Instructions (Signed)
https://www.nhlbi.nih.gov/files/docs/public/heart/dash_brief.pdf">  Plan de alimentacin DASH DASH Eating Plan DASH es la sigla en ingls de "Enfoques Alimentarios para Detener la Hipertensin". El plan de alimentacin DASH ha demostrado:  Bajar la presin arterial elevada (hipertensin).  Reducir el riesgo de diabetes tipo 2, enfermedad cardaca y accidente cerebrovascular.  Ayudar a perder peso. Consejos para seguir este plan Leer las etiquetas de los alimentos  Verifique la cantidad de sal (sodio) por porcin en las etiquetas de los alimentos. Elija alimentos con menos del 5 por ciento del valor diario de sodio. Generalmente, los alimentos con menos de 300 miligramos (mg) de sodio por porcin se encuadran dentro de este plan alimentario.  Para encontrar cereales integrales, busque la palabra "integral" como primera palabra en la lista de ingredientes. Al ir de compras  Compre productos en los que en su etiqueta diga: "bajo contenido de sodio" o "sin agregado de sal".  Compre alimentos frescos. Evite los alimentos enlatados y comidas precocidas o congeladas. Al cocinar  Evite agregar sal cuando cocine. Use hierbas o aderezos sin sal, en lugar de sal de mesa o sal marina. Consulte al mdico o farmacutico antes de usar sustitutos de la sal.  No fra los alimentos. A la hora de cocinar los alimentos opte por hornearlos, hervirlos, grillarlos, asarlos al horno y asarlos a la parrilla.  Cocine con aceites cardiosaludables, como oliva, canola, aguacate, soja o girasol. Planificacin de las comidas  Consuma una dieta equilibrada, que incluya lo siguiente: ? 4o ms porciones de frutas y 4 o ms porciones de verduras por da. Trate de que medio plato de cada comida sea de frutas y verduras. ? De 6 a 8porciones de cereales integrales todos los das. ? Menos de 6 onzas (170g) de carne, aves o pescado magros por da. Una porcin de 3 onzas (85g) de carne tiene casi el mismo tamao que un  mazo de cartas. Un huevo equivale a 1 onza (28g). ? De 2 a 3 porciones de productos lcteos descremados por da. Una porcin es 1taza (237ml). ? 1 porcin de frutos secos, semillas o frijoles 5 veces por semana. ? De 2 a 3 porciones de grasas cardiosaludables. Las grasas saludables llamadas cidos grasos omega-3 se encuentran en alimentos como las nueces, las semillas de lino, las leches fortificadas y los huevos. Estas grasas tambin se encuentran en los pescados de agua fra, como la sardina, el salmn y la caballa.  Limite la cantidad que consume de: ? Alimentos enlatados o envasados. ? Alimentos con alto contenido de grasa trans, como algunos alimentos fritos. ? Alimentos con alto contenido de grasa saturada, como carne con grasa. ? Postres y otros dulces, bebidas azucaradas y otros alimentos con azcar agregada. ? Productos lcteos enteros.  No le agregue sal a los alimentos antes de probarlos.  No coma ms de 4 yemas de huevo por semana.  Trate de comer al menos 2 comidas vegetarianas por semana.  Consuma ms comida casera y menos de restaurante, de bares y comida rpida.   Estilo de vida  Cuando coma en un restaurante, pida que preparen su comida con menos sal o, en lo posible, sin nada de sal.  Si bebe alcohol: ? Limite la cantidad que bebe:  De 0 a 1 medida por da para las mujeres que no estn embarazadas.  De 0 a 2 medidas por da para los hombres. ? Est atento a la cantidad de alcohol que hay en las bebidas que toma. En los [place], una medida equivale a una botella   de cerveza de 12oz (355ml), un vaso de vino de 5oz (148ml) o un vaso de una bebida alcohlica de alta graduacin de 1oz (44ml). Informacin general  Evite ingerir ms de 2300 mg de sal por da. Si tiene hipertensin, es posible que necesite reducir la ingesta de sodio a 1,500 mg por da.  Trabaje con su mdico para mantener un peso saludable o perder peso. Pregntele cul es el peso  recomendado para usted.  Realice al menos 30 minutos de ejercicio que haga que se acelere su corazn (ejercicio aerbico) la mayora de los das de la semana. Estas actividades pueden incluir caminar, nadar o andar en bicicleta.  Trabaje con su mdico o nutricionista para ajustar su plan alimentario a sus necesidades calricas personales. Qu alimentos debo comer? Frutas Todas las frutas frescas, congeladas o disecadas. Frutas enlatadas en jugo natural (sin agregado de azcar). Verduras Verduras frescas o congeladas (crudas, al vapor, asadas o grilladas). Jugos de tomate y verduras con bajo contenido de sodio o reducidos en sodio. Salsa y pasta de tomate con bajo contenido de sodio o reducidas en sodio. Verduras enlatadas con bajo contenido de sodio o reducidas en sodio. Granos Pan de salvado o integral. Pasta de salvado o integral. Arroz integral. Avena. Quinua. Trigo burgol. Cereales integrales y con bajo contenido de sodio. Pan pita. Galletitas de agua con bajo contenido de grasa y sodio. Tortillas de harina integral. Carnes y otras protenas Pollo o pavo sin piel. Carne de pollo o de pavo molida. Cerdo desgrasado. Pescado y mariscos. Claras de huevo. Porotos, guisantes o lentejas secos. Frutos secos, mantequilla de frutos secos y semillas sin sal. Frijoles enlatados sin sal. Cortes de carne vacuna magra, desgrasada. Carne precocida o curada magra y baja en sodio, como embutidos o panes de carne. Lcteos Leche descremada (1%) o descremada. Quesos reducidos en grasa, con bajo contenido de grasa o descremados. Queso blanco o ricota sin grasa, con bajo contenido de sodio. Yogur semidescremado o descremado. Queso con bajo contenido de grasa y sodio. Grasas y aceites Margarinas untables que no contengan grasas trans. Aceite vegetal. Mayonesa y aderezos para ensaladas livianos, reducidos en grasa o con bajo contenido de grasas (reducidos en sodio). Aceite de canola, crtamo, oliva, aguacate, soja y  girasol. Aguacate. Alios y condimentos Hierbas. Especias. Mezclas de condimentos sin sal. Otros alimentos Palomitas de maz y pretzels sin sal. Dulces con bajo contenido de grasas. Es posible que los productos que se enumeran ms arriba no constituyan una lista completa de los alimentos y las bebidas que puede tomar. Consulte a un nutricionista para obtener ms informacin. Qu alimentos debo evitar? Frutas Fruta enlatada en almbar liviano o espeso. Frutas cocidas en aceite. Frutas con salsa de crema o mantequilla. Verduras Verduras con crema o fritas. Verduras en salsa de queso. Verduras enlatadas regulares (que no sean con bajo contenido de sodio o reducidas en sodio). Pasta y salsa de tomates enlatadas regulares (que no sean con bajo contenido de sodio o reducidas en sodio). Jugos de tomate y verduras regulares (que no sean con bajo contenido de sodio o reducidos en sodio). Pepinillos. Aceitunas. Granos Productos de panificacin hechos con grasa, como medialunas, magdalenas y algunos panes. Comidas con arroz o pasta seca listas para usar. Carnes y otras protenas Cortes de carne con alto contenido de grasa. Costillas. Carne frita. Tocino. [place]adela, salame y otras carnes precocidas o curadas, como embutidos o panes de carne. Grasa de la espalda del cerdo (panceta). Salchicha de cerdo. Frutos secos y semillas con sal. Frijoles   enlatados con agregado de sal. Pescado enlatado o ahumado. Huevos enteros o yemas. Pollo o pavo con piel. Lcteos Leche entera o al 2%, crema y mitad leche y mitad crema. Queso crema entero o con toda su grasa. Yogur entero o endulzado. Quesos con toda su grasa. Sustitutos de cremas no lcteas. Coberturas batidas. Quesos para untar y quesos procesados. Grasas y aceites Mantequilla. Margarina en barra. Manteca de cerdo. Lardo. Mantequilla clarificada. Grasa de panceta. Aceites tropicales como aceite de coco, palmiste o palma. Alios y condimentos Sal de cebolla, sal de  ajo, sal condimentada, sal de mesa y sal marina. Salsa [place]. Salsa trtara. Salsa barbacoa. Salsa teriyaki. Salsa de soja, incluso la que tiene contenido reducido de sodio. Salsa de carne. Salsas en lata y envasadas. Salsa de pescado. Salsa de ostras. Salsa rosada. Rbanos picantes comprados en tiendas. [place]. Mostaza. Saborizantes y tiernizantes para carne. Caldo en cubitos. Salsas picantes. Adobos preelaborados o envasados. Aderezos para tacos preelaborados o envasados. Salsas de pepinillos. Aderezos comunes para ensalada. Otros alimentos Palomitas de maz y pretzels con sal. Es posible que los productos que se enumeran ms arriba no constituyan una lista completa de los alimentos y las bebidas que debe evitar. Consulte a un nutricionista para obtener ms informacin. Dnde buscar ms informacin  National Heart, Lung, and Blood Institute (Instituto Nacional del Corazn, los Pulmones y la Sangre): www.nhlbi.nih.gov  American Heart Association (Asociacin Estadounidense del Corazn): www.heart.org  Academy of Nutrition and Dietetics (Academia de Nutricin y Diettica): www.eatright.org  National Kidney Foundation (Fundacin Nacional del Rin): www.kidney.org Resumen  El plan de alimentacin DASH ha demostrado bajar la presin arterial elevada (hipertensin). Tambin puede reducir el riesgo de diabetes tipo 2, enfermedad cardaca y accidente cerebrovascular.  Cuando siga el plan de alimentacin DASH, trate de comer ms frutas frescas y verduras, cereales integrales, carnes magras, lcteos descremados y grasas cardiosaludables.  Con el plan de alimentacin DASH, deber limitar el consumo de sal (sodio) a 2,300 mg por da. Si tiene hipertensin, es posible que necesite reducir la ingesta de sodio a 1,500 mg por da.  Trabaje con su mdico o nutricionista para ajustar su plan alimentario a sus necesidades calricas personales. Esta informacin no tiene como fin reemplazar el consejo  del mdico. Asegrese de hacerle al mdico cualquier pregunta que tenga. Document Revised: 11/03/2019 Document Reviewed: 11/03/2019 Elsevier Patient Education  2021 Elsevier Inc.  

## 2021-02-27 DIAGNOSIS — E039 Hypothyroidism, unspecified: Secondary | ICD-10-CM | POA: Diagnosis not present

## 2021-02-28 LAB — T4, FREE: Free T4: 2.17 ng/dL — ABNORMAL HIGH (ref 0.82–1.77)

## 2021-02-28 LAB — TSH: TSH: 3.25 u[IU]/mL (ref 0.450–4.500)

## 2021-03-19 ENCOUNTER — Ambulatory Visit: Payer: Medicare Other | Admitting: Family Medicine

## 2021-04-16 ENCOUNTER — Other Ambulatory Visit: Payer: Self-pay | Admitting: Family Medicine

## 2021-04-16 DIAGNOSIS — I10 Essential (primary) hypertension: Secondary | ICD-10-CM

## 2021-04-18 ENCOUNTER — Ambulatory Visit: Payer: Medicare Other | Admitting: Family Medicine

## 2021-04-25 ENCOUNTER — Other Ambulatory Visit (HOSPITAL_COMMUNITY): Payer: Self-pay | Admitting: Family Medicine

## 2021-04-25 ENCOUNTER — Other Ambulatory Visit: Payer: Self-pay

## 2021-04-25 ENCOUNTER — Ambulatory Visit
Admission: EM | Admit: 2021-04-25 | Discharge: 2021-04-25 | Disposition: A | Discharge status: Home / Self Care | Payer: Medicare Other | Attending: Emergency Medicine | Admitting: Emergency Medicine

## 2021-04-25 ENCOUNTER — Encounter: Payer: Self-pay | Admitting: Emergency Medicine

## 2021-04-25 DIAGNOSIS — Z1231 Encounter for screening mammogram for malignant neoplasm of breast: Secondary | ICD-10-CM

## 2021-04-25 DIAGNOSIS — R059 Cough, unspecified: Secondary | ICD-10-CM

## 2021-04-25 DIAGNOSIS — U071 COVID-19: Secondary | ICD-10-CM | POA: Diagnosis not present

## 2021-04-25 MED ORDER — BENZONATATE 100 MG PO CAPS: 100.0000 mg | ORAL_CAPSULE | Freq: Three times a day (TID) | ORAL | 0 refills | Status: DC

## 2021-04-25 NOTE — Discharge Instructions (Addendum)
Debe permanecer aislado en su hogar durante 5 das desde el inicio de los sntomas Y ms de 72 horas despus de la resolucin de los sntomas (ausencia de fiebre sin el uso de medicamentos antifebriles y Aeronautical engineer de los sntomas respiratorios), lo que sea ms largo Descanse lo suficiente y empuje lquidos Lawyer prescrito para la tos Use zyrtec de venta libre para la congestin nasal, la secrecin nasal y/o Chief Technology Officer de garganta Use flonasa de venta libre para la congestin nasal y la secrecin nasal Use medicamentos diariamente para Paramedic los sntomas. Use medicamentos de venta libre como ibuprofeno o tylenol segn sea necesario fiebre o dolor [place] o vaya al servicio de urgencias si tiene sntomas nuevos o que [place], como fiebre, tos que [place], dificultad para Industrial/product designer, opresin en el pecho, dolor en el pecho, coloracin azul, cambios en el estado mental, etc.  You should remain isolated in your home for 5 days from symptom onset AND greater than 72 hours after symptoms resolution (absence of fever without the use of fever-reducing medication and improvement in respiratory symptoms), whichever is longer Get plenty of rest and push fluids Tessalon Perles prescribed for cough Use OTC zyrtec for nasal congestion, runny nose, and/or sore throat Use OTC flonase for nasal congestion and runny nose Use medications daily for symptom relief Use OTC medications like ibuprofen or tylenol as needed fever or pain Call or go to the ED if you have any new or worsening symptoms such as fever, worsening cough, shortness of breath, chest tightness, chest pain, turning blue, changes in mental status, etc..Marland Kitchen

## 2021-04-25 NOTE — ED Triage Notes (Signed)
Productive Coughing x 8 days.  Was diagnosed with covid x 9 days ago.

## 2021-04-25 NOTE — ED Provider Notes (Signed)
[facility]   [id number] 04/25/21 Arrival Time: 4098   CC: COVID symptoms  SUBJECTIVE: History from:  [employer] interpreter [place] .  Jamie Torres is a 65 y.o. female who presents with productive cough with white sputum x 8 days  Dx'ed with covid 9 days ago.  Has tried OTC medications without relief.  Denies previous covid infection.  Denies fever, chills, fatigue, sinus pain, rhinorrhea, sore throat, SOB, wheezing, chest pain, nausea, changes in bowel or bladder habits.     ROS: As per HPI.  All other pertinent ROS negative.     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   Current Outpatient Medications on File Prior to Encounter  Medication Sig Dispense Refill   diclofenac (VOLTAREN) 75 MG EC tablet TAKE 1 TABLET BY MOUTH TWICE DAILY WITH A MEAL 60 tablet 3   fish oil-omega-3 fatty acids 1000 MG capsule Take 2 g by mouth daily.     levothyroxine (SYNTHROID) 75 MCG tablet TAKE 1 TABLET BY MOUTH DAILY 30 tablet 5   lisinopril (ZESTRIL) 5 MG tablet TAKE 1 TABLET(5 MG) BY MOUTH DAILY 90 tablet 0   Social History   Socioeconomic History   Marital status: Married    Spouse name: Not on file   Number of children: Not on file   Years of education: Not on file   Highest education level: Not on file  Occupational History   Not on file  Tobacco Use   Smoking status: Never   Smokeless tobacco: Never  Vaping Use   Vaping Use: Never used  Substance and Sexual Activity   Alcohol use: No   Drug use: No   Sexual activity: Not on file  Other Topics Concern   Not on file  Social History Narrative   Not on file    Social Determinants of Health   Financial Resource Strain: Not on file  Food Insecurity: Not on file  Transportation Needs: Not on file  Physical Activity: Not on file  Stress: Not on file  Social Connections: Not on file  Intimate Partner Violence: Not on file   History reviewed. No pertinent family history.  OBJECTIVE:  Vitals:   04/25/21 0836  BP: 136/78  Pulse: 64  Resp: 16  Temp: 97.9 F (36.6 C)  TempSrc: Temporal  SpO2: 95%    General appearance: alert; well-appearing, nontoxic; speaking in full sentences and tolerating own secretions HEENT: NCAT; Ears: EACs clear, TMs pearly gray; Eyes: PERRL.  EOM grossly intact.Nose: nares patent without rhinorrhea, Throat: oropharynx clear, tonsils non erythematous or enlarged, uvula midline  Neck: supple without LAD Lungs: unlabored respirations, symmetrical air entry; cough: absent; no respiratory distress; CTAB Heart: regular rate and rhythm.  Skin: warm and dry Psychological: alert and cooperative; normal mood and affect   ASSESSMENT & PLAN:  1. COVID-19 virus infection   2. Cough     Meds ordered this encounter  Medications   benzonatate (TESSALON) 100 MG capsule    Sig: Take 1 capsule (100 mg total) by mouth every 8 (eight) hours.  Dispense:  21 capsule    Refill:  0    Order Specific Question:   Supervising Provider    Answer:   Eustace Moore [[id number]]    You should remain isolated in your home for 5 days from symptom onset AND greater than 72 hours after symptoms resolution (absence of fever without the use of fever-reducing medication and improvement in respiratory symptoms), whichever is longer Get plenty of rest and push fluids Tessalon Perles prescribed for cough Use OTC zyrtec for nasal congestion, runny nose, and/or sore throat Use OTC flonase for nasal congestion and runny nose Use medications daily for symptom relief Use OTC medications like ibuprofen or tylenol as needed fever or pain Call  or go to the ED if you have any new or worsening symptoms such as fever, worsening cough, shortness of breath, chest tightness, chest pain, turning blue, changes in mental status, etc...   Reviewed expectations re: course of current medical issues. Questions answered. Outlined signs and symptoms indicating need for more acute intervention. Patient verbalized understanding. After Visit Summary given.          Rennis Harding, PA-C 04/25/21 [phone]

## 2021-05-20 ENCOUNTER — Other Ambulatory Visit: Payer: Self-pay

## 2021-05-20 ENCOUNTER — Ambulatory Visit (HOSPITAL_COMMUNITY)
Admission: RE | Admit: 2021-05-20 | Discharge: 2021-05-20 | Disposition: A | Discharge status: Home / Self Care | Payer: Medicare Other | Source: Ambulatory Visit | Attending: Family Medicine | Admitting: Family Medicine

## 2021-05-20 DIAGNOSIS — Z1231 Encounter for screening mammogram for malignant neoplasm of breast: Secondary | ICD-10-CM | POA: Diagnosis not present

## 2021-05-23 ENCOUNTER — Ambulatory Visit (INDEPENDENT_AMBULATORY_CARE_PROVIDER_SITE_OTHER): Payer: Medicare Other | Admitting: Family Medicine

## 2021-05-23 ENCOUNTER — Other Ambulatory Visit: Payer: Self-pay

## 2021-05-23 ENCOUNTER — Encounter: Payer: Self-pay | Admitting: Family Medicine

## 2021-05-23 VITALS — BP 108/70 | Temp 97.3°F | Wt 139.2 lb

## 2021-05-23 DIAGNOSIS — E039 Hypothyroidism, unspecified: Secondary | ICD-10-CM | POA: Diagnosis not present

## 2021-05-23 DIAGNOSIS — E782 Mixed hyperlipidemia: Secondary | ICD-10-CM

## 2021-05-23 DIAGNOSIS — M25541 Pain in joints of right hand: Secondary | ICD-10-CM | POA: Diagnosis not present

## 2021-05-23 DIAGNOSIS — M25542 Pain in joints of left hand: Secondary | ICD-10-CM | POA: Diagnosis not present

## 2021-05-23 DIAGNOSIS — G4489 Other headache syndrome: Secondary | ICD-10-CM

## 2021-05-23 DIAGNOSIS — I10 Essential (primary) hypertension: Secondary | ICD-10-CM

## 2021-05-23 MED ORDER — LISINOPRIL 5 MG PO TABS: ORAL_TABLET | ORAL | 1 refills | Status: DC

## 2021-05-23 MED ORDER — DICLOFENAC SODIUM 75 MG PO TBEC: 75.0000 mg | DELAYED_RELEASE_TABLET | Freq: Two times a day (BID) | ORAL | 1 refills | Status: DC

## 2021-05-23 MED ORDER — LEVOTHYROXINE SODIUM 75 MCG PO TABS: ORAL_TABLET | ORAL | 1 refills | Status: DC

## 2021-05-23 NOTE — Progress Notes (Signed)
   Subjective:    Patient ID: Jamie Torres, female    DOB: [birth year], 65 y.o.   MRN: [id number]  HPI Pt here for follow up on blood pressure. Pt does sometime check blood pressure at home. Taking Lisinopril daily.  Interpreter was used  Patient for blood pressure check up.  The patient does have hypertension.    Medication compliance-very good  Blood pressure control recently-as best she knows it is under control  Dietary compliance-tries to eat healthy does do walking on a daily basis  Patient has thyroid condition.  Takes thyroid medication on a regular basis.  States that the proper way.  Relates compliance.  States no negative side effects.  States condition seems to be under good control.     Review of Systems     Objective:   Physical Exam  General-in no acute distress Eyes-no discharge Lungs-respiratory rate normal, CTA CV-no murmurs,RRR Extremities skin warm dry no edema Neuro grossly normal Behavior normal, alert       Assessment & Plan:  1. Hypothyroidism, unspecified type Patient was seen today regarding hypothyroidism.  Importance of healthy diet, regular physical activity was discussed.  Importance of compliance with medication and regular checks regarding this was discussed.   - Lipid Profile - Hepatic function panel - Basic Metabolic Panel (BMET)  2. Essential hypertension HTN- patient seen for follow-up regarding HTN.  Diet, medication compliance, appropriate labs and refills were completed.  Importance of keeping blood pressure under good control to lessen the risk of complications discussed  - Lipid Profile - Hepatic function panel - Basic Metabolic Panel (BMET) - lisinopril (ZESTRIL) 5 MG tablet; TAKE 1 TABLET(5 MG) BY MOUTH DAILY  Dispense: 90 tablet; Refill: 1  3. Mixed hyperlipidemia Tries to watch her diet we will check lipid profile - Lipid Profile - Hepatic function panel - Basic Metabolic Panel (BMET)  May use diclofenac for  her arthralgias in her hands Follow-up within 4 to 6 months  Shingrix vaccine recommended  Patient had colonoscopy January 2020 to request the findings

## 2021-05-23 NOTE — Patient Instructions (Signed)
Zoster Vaccine, Recombinant injection Qu es este medicamento? [employer] CONTRA EL ZSTER es una vacuna que se [place] para reducir el riesgo de contraer herpes zster (culebrilla). Esta vacuna no se [place] para tratar elherpes zster o el dolor neurolgico causado por herpes zster. Este medicamento puede ser utilizado para otros usos; si tiene Designer, industrial/product preguntaconsulte con su proveedor de atencin mdica o con su farmacutico. MARCAS COMUNES: [place] Qu le debo informar a mi profesional de la salud antes de tomar estemedicamento? Necesitan saber si usted presenta alguno de los siguientes problemas osituaciones: cncer problemas del sistema inmunolgico una reaccin alrgica o inusual a la vacuna contra el Zster, a otros medicamentos, alimentos, colorantes o conservantes si est embarazada o buscando quedar embarazada si estamamantando a un beb Cmo debo [employer]? Esta vacuna se inyecta en un msculo. La administra un proveedor de atencinmdica. Recibir una copia de informacin escrita sobre la vacuna antes de cada vacuna. Asegrese de leer esta informacin cada vez cuidadosamente. Esta hoja puedecambiar frecuentemente. Hable con su proveedor de atencin [employer] uso de esta vacuna en nios.Esta vacuna no est aprobado para uso en nios. Sobredosis: Pngase en contacto inmediatamente con un centro toxicolgico o unasala de urgencia si usted cree que haya tomado demasiado medicamento. ATENCIN: [employer] es solo para usted. No comparta este medicamento connadie. Qu sucede si me olvido de una dosis? Cumpla con las citas para dosis de seguimiento (refuerzo). Es importante no olvidar ninguna dosis. Llame a su proveedor de atencin mdica si no puedeasistir a una cita. Qu puede interactuar con este medicamento? medicamentos que suprimen el sistema inmunolgico medicamentos para tratar Firefighter esteroideos, tales como la prednisona o la cortisona Puede  ser que esta lista no menciona todas las posibles interacciones. Informe a su profesional de [employer] de [employer] productos a base de hierbas, medicamentos de [place] o suplementos nutritivos que est tomando. Si usted fuma, consume bebidas alcohlicas o si utiliza drogas ilegales, indqueselo tambin a su profesional de [employer]. Algunas sustancias pueden interactuar consu medicamento. A qu debo estar atento al usar [employer] Corporation? Visite peridicamente a su proveedor de Psychologist, prison and probation services. Es posible que esta [place], como todas las vacunas, no protejan completamente atodos. Qu efectos secundarios puedo tener al [employer] este medicamento? Efectos secundarios que debe informar a su mdico o a su profesional de lasalud tan pronto como sea posible: Therapist, art (erupcin cutnea, comezn/picazn o urticaria; hinchaznde la cara, los labios o la lengua) problemas para respirar Efectos secundarios que generalmente no requieren atencin mdica (debe informarlos a su mdico o a Producer, television/film/video de la salud si persisten o si sonmolestos): escalofros dolor de cabeza fiebre nuseas dolor, enrojecimiento o Geophysical data processor de la inyeccin cansancio vmito Puede ser que esta lista no menciona todos los posibles efectos secundarios. Comunquese a su mdico por asesoramiento mdico [employer]. Usted puede informar los efectos secundarios a la FDA por telfono al1-800-FDA-1088. Dnde debo guardar mi medicina? Esta vacuna solamente es administrada por un proveedor de Psychologist, prison and probation services. Nose guardar en su casa. ATENCIN: Este folleto es un resumen. Puede ser que no cubra toda la posible informacin. Si usted tiene preguntas acerca de esta medicina, consulte con sumdico, su farmacutico o su profesional de Radiographer, therapeutic.  2022 Elsevier/Gold Standard (2020-04-03 00:00:00) [email or web address]://www.mata.[email or web address]">  Plan de alimentacin DASH DASH Eating  Plan DASH es la sigla en ingls de "Enfoques Alimentarios para Detener la Hipertensin". El plan de alimentacin DASH ha demostrado: Bajar la presin arterial  elevada (hipertensin). Reducir el riesgo de diabetes tipo 2, enfermedad cardaca y accidente cerebrovascular. Ayudar a perder peso. Consejos para seguir Consulting civil engineer las etiquetas de los alimentos Verifique la cantidad de sal (sodio) por porcin en las etiquetas de los alimentos. Elija alimentos con menos del 5 por ciento del valor diario de sodio. Generalmente, los alimentos con menos de 300 miligramos (mg) de sodio por porcin se encuadran dentro de este plan alimentario. Para encontrar cereales integrales, busque la palabra "integral" como primera palabra en la lista de ingredientes. Al ir de compras Compre productos en los que en su etiqueta diga: "bajo contenido de sodio" o "sin agregado de sal". Compre alimentos frescos. Evite los alimentos enlatados y comidas precocidas o congeladas. Al cocinar Evite agregar sal cuando cocine. Use hierbas o aderezos sin sal, en lugar de sal de mesa o sal marina. Consulte al mdico o farmacutico antes de usar sustitutos de la sal. No fra los alimentos. A la hora de cocinar los alimentos opte por hornearlos, hervirlos, grillarlos, asarlos al horno y asarlos a Patent attorney. Cocine con aceites cardiosaludables, como oliva, canola, aguacate, soja o girasol. Planificacin de las comidas  Consuma una dieta equilibrada, que incluya lo siguiente: 4 o ms porciones de frutas y 4 o ms porciones de Warehouse manager. Trate de que medio plato de cada comida sea de frutas y verduras. De 6 a 8 porciones de [place] [employer]. Menos de 6 onzas (170 g) de carne, aves o pescado Copy. Una porcin de 3 onzas (85 g) de carne tiene casi el mismo tamao que un mazo de cartas. Un huevo equivale a 1 onza (28 g). De 2 a 3 porciones de productos lcteos descremados por da. Una porcin es 1 taza  (237 ml). 1 porcin de frutos secos, semillas o frijoles 5 veces por semana. De 2 a 3 porciones de grasas cardiosaludables. Las grasas saludables llamadas cidos grasos omega-3 se encuentran en alimentos como las nueces, las semillas de [place], las leches fortificadas y [place]. Estas grasas tambin se encuentran en los pescados de agua fra, como la sardina, el salmn y la caballa. Limite la cantidad que consume de: Alimentos enlatados o envasados. Alimentos con alto contenido de grasa trans, como algunos alimentos fritos. Alimentos con alto contenido de grasa saturada, como carne con grasa. Postres y otros dulces, bebidas azucaradas y otros alimentos con azcar agregada. Productos lcteos enteros. No le agregue sal a los alimentos antes de probarlos. No coma ms de 4 yemas de huevo por semana. Trate de comer al menos 2 comidas vegetarianas por semana. Consuma ms comida casera y menos de restaurante, de bares y comida rpida.  Estilo de vida Cuando coma en un restaurante, pida que preparen su comida con menos sal o, en lo posible, sin nada de sal. Si bebe alcohol: Limite la cantidad que bebe: De 0 a 1 medida por da para las mujeres que no estn embarazadas. De 0 a 2 medidas por da para los hombres. Est atento a la cantidad de alcohol que hay en las bebidas que toma. En los [address], una medida equivale a una botella de cerveza de 12 oz (355 ml), un vaso de vino de 5 oz (148 ml) o un vaso de una bebida alcohlica de alta graduacin de 1 oz (44 ml). Informacin general Evite ingerir ms de 2300 mg de sal por da. Si tiene hipertensin, es posible que necesite reducir la ingesta de sodio a 1,500 mg por  da. Jamey Reas con su mdico para mantener un peso saludable o perder peso. Pregntele cul es el peso recomendado para usted. Realice al menos 30 minutos de ejercicio que haga que se acelere su corazn (ejercicio Magazine features editor) la [employer] de la [place]. Estas actividades pueden  incluir caminar, nadar o andar en bicicleta. Trabaje con su mdico o nutricionista para ajustar su plan alimentario a sus necesidades calricas personales. Qu alimentos debo comer? Frutas Todas las frutas frescas, congeladas o disecadas. Frutas enlatadas en jugonatural (sin agregado de azcar). Verduras Verduras frescas o congeladas (crudas, al vapor, asadas o grilladas). Jugos de tomate y verduras con bajo contenido de sodio o reducidos en sodio. Salsa y pasta de tomate con bajo contenido de sodio o reducidas en sodio. Verdurasenlatadas con bajo contenido de sodio o reducidas en sodio. Granos Pan de salvado o integral. Pasta de salvado o integral. Arroz integral. Avena. Quinua. Trigo burgol. Cereales integrales y con bajo contenido de [place]. Pan pita. Galletitas de [place] con bajo contenido de [place] y [place]. Tortillas deharina integral. Carnes y otras protenas Pollo o pavo sin piel. Carne de pollo o de [place]. Cerdo desgrasado. Pescado y [employer]. Claras de huevo. Porotos, guisantes o lentejas secos. Frutos secos, mantequilla de frutos secos y semillas sin sal. Frijoles enlatados sin sal. Cortes de carne vacuna magra, desgrasada. Carne precocida Lolita Patella y baja en sodio, como embutidos o panes de carne. Lcteos Leche descremada (1 %) o descremada. Quesos reducidos en grasa, con bajo contenido de grasa o descremados. Queso blanco o ricota sin grasa, con bajo contenido de [place]. Yogur semidescremado o descremado. Queso con bajo contenidode grasa y [place]. Grasas y [employer] untables que no contengan grasas trans. Aceite vegetal. Jerolyn Shin y aderezos para ensaladas livianos, reducidos en grasa o con bajo contenido de grasas (reducidos en sodio). Aceite de canola,crtamo, oliva, aguacate, soja y [place]. [place]. Alios y condimentos Hierbas. Especias. Mezclas de condimentos sin sal. Otros alimentos Palomitas de maz y pretzels sin sal. Dulces con bajo contenido de grasas. Es  posible que los productos que se enumeran ms [place] no constituyan una lista completa de los alimentos y las bebidas que puede tomar. Consulte a un nutricionista para obtener ms informacin. Qu alimentos debo evitar? Nils Pyle Fruta enlatada en almbar liviano o espeso. Frutas cocidas en aceite. Frutascon salsa de crema o [place]. Verduras Verduras con crema o fritas. Verduras en salsa de [place]. Verduras enlatadas regulares (que no sean con bajo contenido de sodio o reducidas en sodio). Pasta y salsa de tomates enlatadas regulares (que no sean con bajo contenido de sodio o reducidas en sodio). Jugos de tomate y verduras regulares (que no sean conbajo contenido de sodio o reducidos en sodio). Pepinillos. Aceitunas. Granos Productos de panificacin hechos con grasa, como medialunas, magdalenas yalgunos panes. Comidas con arroz o pasta seca listas para usar. Carnes y [address] de carne con alto contenido de Holiday representative. Costillas. Carne frita. [place]. [place], salame y otras carnes precocidas o curadas, como embutidos o panes de carne. Grasa de la espalda del cerdo (panceta). Salchicha de cerdo. Frutos secos y semillas con sal. Frijoles enlatados con agregado de sal. Pescado enlatado o ahumado. Huevos enteros o yemas. Pollo opavo con piel. Lcteos Leche entera o al 2 %, crema y [address] y mitad crema. Queso crema entero o con toda su grasa. Yogur entero o endulzado. Quesos con toda su grasa. Sustitutos de cremas no lcteas. Coberturas batidas. Quesos para untar y quesosprocesados. Grasas y [employer]. Bonne Dolores  en barra. Manteca de cerdo. Lardo. Mantequilla clarificada. Grasa de panceta. Aceites tropicales como aceite de coco, palmisteo palma. Alios y condimentos Sal de cebolla, sal de ajo, sal condimentada, sal de mesa y sal marina. Salsa [place]. Salsa trtara. Salsa barbacoa. Salsa teriyaki. Salsa de soja, incluso la que tiene contenido reducido de [place]. Salsa de  carne. Salsas en lata y envasadas. Salsa de pescado. Salsa de [place] Islandostras. Salsa rosada. Rbanos picantes comprados en tiendas. [place]. [place]. Saborizantes y tiernizantes para carne. Caldo en cubitos. Salsas picantes. Adobos preelaborados o envasados. Aderezos para tacos preelaborados o envasados. Salsas de pepinillos.Aderezos comunes para ensalada. Otros alimentos Palomitas de maz y pretzels con sal. Es posible que los productos que se enumeran ms arriba no constituyan una lista completa de los alimentos y las bebidas que Personnel officerdebe evitar. Consulte a un nutricionista para obtener ms informacin. Dnde buscar ms informacin National Heart, Lung, and Blood Institute (Instituto Nacional del Urbanaorazn, los Pulmones y Risk managerla Sangre): PopSteam.iswww.nhlbi.nih.gov American Heart Association (Asociacin Estadounidense del Corazn): www.heart.org Academy of Nutrition and Dietetics (Academia de Nutricin y Pension scheme managerDiettica): www.eatright.org National Kidney Foundation (Fundacin Nacional del Rin): www.kidney.org Resumen El plan de alimentacin DASH ha demostrado bajar la presin arterial elevada (hipertensin). Tambin puede reducir Lexmark Internationalel riesgo de diabetes tipo 2, enfermedad cardaca y accidente cerebrovascular. Cuando siga el plan de alimentacin DASH, trate de comer ms frutas frescas y verduras, cereales integrales, carnes magras, lcteos descremados y grasas cardiosaludables. Con el plan de alimentacin DASH, deber limitar el consumo de sal (sodio) a 2,300 mg por da. Si tiene hipertensin, es posible que necesite reducir la ingesta de sodio a 1,500 mg por da. Trabaje con su mdico o nutricionista para ajustar su plan alimentario a sus necesidades calricas personales. Esta informacin no tiene Theme park managercomo fin reemplazar el consejo del mdico. Asegresede hacerle al mdico cualquier pregunta que tenga. Document Revised: 11/03/2019 Document Reviewed: 11/03/2019 Elsevier Patient Education  2022 [employer]

## 2021-05-28 DIAGNOSIS — G4489 Other headache syndrome: Secondary | ICD-10-CM | POA: Diagnosis not present

## 2021-05-28 DIAGNOSIS — I10 Essential (primary) hypertension: Secondary | ICD-10-CM | POA: Diagnosis not present

## 2021-05-28 DIAGNOSIS — E782 Mixed hyperlipidemia: Secondary | ICD-10-CM | POA: Diagnosis not present

## 2021-05-28 DIAGNOSIS — E039 Hypothyroidism, unspecified: Secondary | ICD-10-CM | POA: Diagnosis not present

## 2021-05-29 LAB — BASIC METABOLIC PANEL
BUN/Creatinine Ratio: 25 (ref 12–28)
BUN: 20 mg/dL (ref 8–27)
CO2: 20 mmol/L (ref 20–29)
Calcium: 9.2 mg/dL (ref 8.7–10.3)
Chloride: 104 mmol/L (ref 96–106)
Creatinine, Ser: 0.8 mg/dL (ref 0.57–1.00)
Glucose: 102 mg/dL — ABNORMAL HIGH (ref 65–99)
Potassium: 4.4 mmol/L (ref 3.5–5.2)
Sodium: 138 mmol/L (ref 134–144)
eGFR: 81 mL/min/{1.73_m2} (ref >59)

## 2021-05-29 LAB — LIPID PANEL
Chol/HDL Ratio: 7.7 ratio — ABNORMAL HIGH (ref 0.0–4.4)
Cholesterol, Total: 270 mg/dL — ABNORMAL HIGH (ref 100–199)
HDL: 35 mg/dL — ABNORMAL LOW (ref >39)
LDL Chol Calc (NIH): 207 mg/dL — ABNORMAL HIGH (ref 0–99)
Triglycerides: 151 mg/dL — ABNORMAL HIGH (ref 0–149)
VLDL Cholesterol Cal: 28 mg/dL (ref 5–40)

## 2021-05-29 LAB — HEPATIC FUNCTION PANEL
ALT: 34 IU/L — ABNORMAL HIGH (ref 0–32)
AST: 28 IU/L (ref 0–40)
Albumin: 4.4 g/dL (ref 3.8–4.8)
Alkaline Phosphatase: 82 IU/L (ref 44–121)
Bilirubin Total: 0.3 mg/dL (ref 0.0–1.2)
Bilirubin, Direct: 0.11 mg/dL (ref 0.00–0.40)
Total Protein: 7.5 g/dL (ref 6.0–8.5)

## 2021-06-05 ENCOUNTER — Other Ambulatory Visit: Payer: Self-pay | Admitting: Family Medicine

## 2021-06-05 DIAGNOSIS — R748 Abnormal levels of other serum enzymes: Secondary | ICD-10-CM

## 2021-06-12 ENCOUNTER — Ambulatory Visit (HOSPITAL_COMMUNITY)
Admission: RE | Admit: 2021-06-12 | Discharge: 2021-06-12 | Disposition: A | Discharge status: Home / Self Care | Payer: Medicare Other | Source: Ambulatory Visit | Attending: Family Medicine | Admitting: Family Medicine

## 2021-06-12 ENCOUNTER — Other Ambulatory Visit: Payer: Self-pay

## 2021-06-12 DIAGNOSIS — K760 Fatty (change of) liver, not elsewhere classified: Secondary | ICD-10-CM | POA: Diagnosis not present

## 2021-06-12 DIAGNOSIS — R945 Abnormal results of liver function studies: Secondary | ICD-10-CM | POA: Diagnosis not present

## 2021-06-12 DIAGNOSIS — R748 Abnormal levels of other serum enzymes: Secondary | ICD-10-CM | POA: Insufficient documentation

## 2021-07-03 ENCOUNTER — Encounter: Payer: Self-pay | Admitting: Family Medicine

## 2021-07-03 ENCOUNTER — Ambulatory Visit (INDEPENDENT_AMBULATORY_CARE_PROVIDER_SITE_OTHER): Payer: Medicare Other | Admitting: Family Medicine

## 2021-07-03 ENCOUNTER — Other Ambulatory Visit: Payer: Self-pay

## 2021-07-03 VITALS — BP 119/80 | HR 70 | Temp 97.2°F | Wt 141.6 lb

## 2021-07-03 DIAGNOSIS — M546 Pain in thoracic spine: Secondary | ICD-10-CM | POA: Diagnosis not present

## 2021-07-03 DIAGNOSIS — Z23 Encounter for immunization: Secondary | ICD-10-CM | POA: Diagnosis not present

## 2021-07-03 DIAGNOSIS — K219 Gastro-esophageal reflux disease without esophagitis: Secondary | ICD-10-CM | POA: Diagnosis not present

## 2021-07-03 DIAGNOSIS — R058 Other specified cough: Secondary | ICD-10-CM | POA: Diagnosis not present

## 2021-07-03 DIAGNOSIS — E782 Mixed hyperlipidemia: Secondary | ICD-10-CM | POA: Diagnosis not present

## 2021-07-03 DIAGNOSIS — K760 Fatty (change of) liver, not elsewhere classified: Secondary | ICD-10-CM | POA: Diagnosis not present

## 2021-07-03 DIAGNOSIS — Z79899 Other long term (current) drug therapy: Secondary | ICD-10-CM | POA: Diagnosis not present

## 2021-07-03 MED ORDER — ROSUVASTATIN CALCIUM 10 MG PO TABS: ORAL_TABLET | ORAL | 3 refills | Status: DC

## 2021-07-03 MED ORDER — FAMOTIDINE 40 MG PO TABS: ORAL_TABLET | ORAL | 5 refills | Status: DC

## 2021-07-03 NOTE — Patient Instructions (Signed)
Colesterol elevado High Cholesterol El colesterol elevado es una afeccin que se caracteriza porque la sangre tiene niveles altos de una sustancia blanca, cerosa y parecida a la grasa (colesterol). El hgado fabrica todo el colesterol que el organismo necesita. El organismo humano necesita pequeas cantidades de colesterol para formar las clulas. El exceso de colesterol se obtiene de los alimentos que se consumen. La sangre transporta el colesterol desde el hgado al resto del cuerpo. Si tiene el colesterol elevado, pueden acumularse depsitos (placas) en las paredes de las arterias. Las arterias son los vasos sanguneos que transportan la sangre desde el corazn al resto del cuerpo. Las placas causan que las arterias se estrechen y se endurezcan. Las placas de colesterol aumentan el riesgo de sufrir un infarto de miocardio y un accidente cerebrovascular. Trabaje con el mdico para mantener las concentraciones de colesterol en un rango saludable. Qu incrementa el riesgo? Los siguientes factores pueden hacer que sea ms propenso a desarrollar esta afeccin: Consumir alimentos con alto contenido de grasa animal (grasa saturada) o colesterol. Tener sobrepeso. No hacer suficiente ejercicio fsico. Tener antecedentes familiares de colesterol alto (hipercolesterolemia familiar). Consumir productos con tabaco. Tener diabetes. Cules son los signos o sntomas? En la mayora de los casos, el colesterol alto no causa ningn sntoma por lo general. En los casos graves, los niveles muy altos de colesterol pueden causar: Protuberancias de grasa debajo de la piel (xantomas). Un anillo blanco o gris alrededor del centro negro (pupila) del ojo. Cmo se diagnostica? Esta afeccin se puede diagnosticar en funcin de los resultados de un anlisis de sangre. Si es mayor de 20 aos, es posible que el mdico le controle el nivel de colesterol cada 4 a 6 aos. Los controles pueden ser ms frecuentes si tiene el  colesterol elevado u otros factores de riesgo de enfermedad cardaca. En el anlisis de sangre de colesterol, se determina lo siguiente: El colesterol "malo", o colesterol LDL. Este es el principal tipo de colesterol que causa enfermedades cardacas. El nivel recomendado es de menos de 100 mg/dl (2.59 mmol/l). El colesterol "bueno", o colesterol HDL. El HDL ayuda a proteger contra la enfermedad cardaca porque limpia las arterias y arrastra el LDL al hgado para que lo procese. El nivel recomendado de HDL es de 60 mg/dl (1.55 mmol/l) o ms. Triglicridos. Estos son grasas que el organismo puede almacenar o quemar como fuente de energa. El nivel recomendado es de menos de 150 mg/dl (1.69 mmol/l). Colesterol total. Mide la cantidad total de colesterol en la sangre, e incluye el colesterol LDL, el colesterol HDL y los triglicridos. El nivel recomendado es de menos de 200 mg/dl (5.17 mmol/l). Cmo se trata? El tratamiento para el colesterol alto comienza con cambios en el estilo de vida, tales como dieta y ejercicio. Cambios en la dieta. Es posible que le indiquen que consuma alimentos con ms fibra y menos grasas saturadas o azcar agregada. Cambios en el estilo de vida. Estos pueden incluir hacer actividad fsica con regularidad, mantener un peso saludable y dejar de consumir productos con tabaco. Medicamentos. Estos se administran cuando los cambios en la dieta y en el estilo de vida no han sido eficaces. Es posible que le receten medicamentos llamados estatinas para bajar sus niveles de colesterol. Siga estas instrucciones en su casa: Comida y bebida  Siga una dieta saludable y equilibrada. Esta dieta incluye lo siguiente: Porciones diarias de frutas y verduras frescas, congeladas o enlatadas. Porciones diarias de alimentos integrales con alto contenido de fibra. Alimentos con   bajo contenido de grasas saturadas y grasas trans. Estos incluyen carne de ave y pescado sin piel, cortes de carne magros  y productos lcteos descremados. Una variedad de pescado, especialmente pescado graso que contenga cidos grasos omega 3. Propngase comer pescado al menos dos veces por semana. Evite los alimentos y las bebidas que tengan azcar agregada. Use mtodos de coccin saludables, como asar, grillar, hervir, hornear, escalfar, cocer al vapor y saltear. No fra los alimentos excepto para saltearlos. Si bebe alcohol: Limite la cantidad que bebe a lo siguiente: De 0 a 1 medida por da para las mujeres que no estn embarazadas. De 0 a 2 medidas por da para los hombres. Sepa cunta cantidad de alcohol hay en las bebidas. En los [place], una medida equivale a una botella de cerveza de 12 oz (355 ml), un vaso de vino de 5 oz (148 ml) o un vaso de una bebida alcohlica de alta graduacin de 1 oz (44 ml). Estilo de vida  Haga ejercicio con regularidad. Trate de hacer un total de 150 minutos de actividad fsica por semana. Aumente la cantidad de ejercicio fsico que realiza mediante actividades como la jardinera, salir a caminar o usar las escaleras. No consuma ningn producto que contenga nicotina o tabaco. Estos productos incluyen cigarrillos, tabaco para mascar y aparatos de vapeo, como los cigarrillos electrnicos. Si necesita ayuda para dejar de consumir estos productos, consulte al mdico. Indicaciones generales Use los medicamentos de venta libre y los recetados solamente como se lo haya indicado el mdico. Concurra a todas las visitas de seguimiento. Esto es importante. Dnde buscar ms informacin American Heart Association (Asociacin Estadounidense del Corazn): www.heart.org National Heart, Lung, and Blood Institute (Instituto Nacional del Corazn, los Pulmones y la Sangre): www.nhlbi.nih.gov Comunquese con un mdico si: Tiene dificultad para alcanzar o mantener una alimentacin sana y un peso saludable. Est por comenzar un programa de ejercicios. No puede dejar de fumar. Solicite ayuda  de inmediato si: Siente dolor en el pecho. Tiene dificultad para respirar. Tiene molestias o dolor en la mandbula, el cuello, la espalda, los hombros o los brazos. Tiene sntomas de un accidente cerebrovascular. "BE FAST" es una manera fcil de recordar los principales signos de advertencia de un accidente cerebrovascular: B: Balance (equilibrio). Los signos son mareos, dificultad repentina para caminar o prdida del equilibrio. E - Eyes (ojos). Los signos son problemas para ver o un cambio repentino en la visin. F: Face (rostro). Los signos son debilidad repentina o entumecimiento del rostro, o el rostro o el prpado que se caen hacia un lado. A: Arms (brazos). Los signos son debilidad o entumecimiento en un brazo. Esto sucede de repente y generalmente en un lado del cuerpo. S: Speech (habla). Los signos son dificultad para hablar, hablar arrastrando las palabras o dificultad para comprender lo que las personas dicen. T: Time (tiempo). Es tiempo de llamar al servicio de emergencias. Anote la hora a la que comenzaron los sntomas. Presenta otros signos de un accidente cerebrovascular, como los siguientes: Dolor de cabeza sbito e intenso que no tiene causa aparente. Nuseas o vmitos. Convulsiones. Estos sntomas pueden representar un problema grave que constituye una emergencia. No espere a ver si los sntomas desaparecen. Solicite atencin mdica de inmediato. Comunquese con el servicio de emergencias de su localidad (911 en los [place]). No conduzca por sus propios medios hasta el hospital. Resumen Las placas de colesterol aumentan el riesgo de sufrir un infarto de miocardio y un accidente cerebrovascular. Trabaje con el mdico para   mantener las concentraciones de colesterol en un rango saludable. Siga una dieta saludable y equilibrada, haga ejercicio con regularidad y Jamie Torres un peso saludable. No consuma ningn producto que contenga nicotina o tabaco. Estos productos incluyen  cigarrillos, tabaco para Theatre manager y aparatos de vapeo, como los Administrator, Civil Service. Obtenga ayuda de inmediato si tiene cualquier sntoma de un accidente cerebrovascular. Esta informacin no tiene Theme park manager el consejo del mdico. Asegrese de hacerle al mdico cualquier pregunta que tenga. Document Revised: 12/17/2020 Document Reviewed: 12/17/2020 Elsevier Patient Education  2022 Elsevier Inc. Enfermedad del hgado graso Fatty Liver Disease El hgado transforma los alimentos en energa, elimina las sustancias txicas de la [place], fabrica protenas importantes y absorbe las vitaminas necesarias de los alimentos. La enfermedad del hgado graso ocurre cuando se acumula demasiada grasa en las clulas del hgado. La enfermedad del hgado graso tambin se llama esteatosis heptica. En muchos casos, la enfermedad del hgado graso no provoca sntomas ni problemas. Con frecuencia, se diagnostica cuando se realizan estudios por otros motivos. Sin embargo, con el [place], el hgado graso puede provocar una inflamacin que posiblemente cause problemas hepticos ms graves, como la fibrosis heptica (cirrosis) o insuficiencia heptica. El hgado graso se asocia con la resistencia a la insulina, el aumento de la grasa corporal, la presin arterial alta (hipertensin) y el colesterol elevado. Estas son caractersticas del sndrome metablico y [place] el riesgo de accidente cerebrovascular, diabetes y enfermedad cardaca. Cules son las causas? Esta afeccin puede estar causada por componentes del sndrome metablico: Obesidad. Resistencia a la insulina. Colesterol alto. Otras causas: Consumo excesivo de alcohol. Nutricin deficiente. Sndrome de [place]. Embarazo. Determinados medicamentos. Txicos. Algunas infecciones virales. Qu incrementa el riesgo? Es ms probable que tengan esta afeccin las personas que: Consumen alcohol en exceso. Tienen sobrepeso. Tienen diabetes. Tienen  hepatitis. Tienen un nivel alto de triglicridos. Estn embarazadas. Cules son los signos o sntomas? Con frecuencia, la enfermedad del hgado graso no provoca sntomas. Si se desarrollan sntomas, estos pueden incluir: Fatiga y debilidad. Prdida de peso. Confusin. Nuseas, vmitos o dolor abdominal. Color amarillo en la piel y en la zona blanca de los ojos (ictericia). Picazn en la piel. Cmo se diagnostica? Este trastorno puede diagnosticarse mediante: Un examen fsico y los antecedentes mdicos. Anlisis de [place]. Estudios de diagnstico por imgenes, como ecografa, exploracin por tomografa computarizada (TC) o Health visitor (RM). Biopsia de hgado. Se extrae una pequea muestra de tejido del hgado usando [place]. La muestra se examina en el microscopio. Cmo se trata? Con frecuencia, la enfermedad del hgado graso es causada por otras afecciones. El tratamiento para el hgado graso puede incluir medicamentos y cambios en el estilo de vida para controlar enfermedades como: Alcoholismo. Colesterol alto. Diabetes. Sobrepeso u obesidad. Siga estas instrucciones en su casa:  No beba alcohol. Si tiene problemas para dejar de beber, consulte al mdico cmo puede dejar de beber de forma segura con la ayuda de medicamentos o un programa con supervisin. Esto es importante para evitar que la afeccin empeore. Siga una dieta saludable como se lo haya indicado el mdico. Consulte al mdico sobre trabajar con un nutricionista para Runner, broadcasting/film/video de alimentacin. Haga ejercicio regularmente. Esto puede ayudarlo a [employer], y a Public house manager y la diabetes. Hable con el mdico sobre qu actividades son mejores para usted y IT trainer de ejercicios. Use los medicamentos de venta libre y los recetados solamente como se lo haya indicado el mdico. Cumpla con todas las visitas de seguimiento.  Esto es importante. Comunquese con un mdico si: Tiene dificultad  para controlar lo siguiente: Nivel de Banker. Esto es muy importante si tiene diabetes. Colesterol. El consumo de alcohol. Solicite ayuda de inmediato si: Siente dolor abdominal. Tiene ictericia. Tiene nuseas y vomita. Vomita sangre o una sustancia similar al poso del caf. Las heces son negras, alquitranadas o sanguinolentas. Resumen La enfermedad del hgado graso se desarrolla cuando se acumula demasiada grasa en las clulas del hgado. Con frecuencia, la enfermedad del hgado no causa sntomas ni problemas. Sin embargo, con Museum/gallery conservator, el hgado graso puede provocar una inflamacin que puede causar problemas hepticos ms graves, como fibrosis heptica (cirrosis). Es ms probable que desarrolle esta afeccin si consume alcohol en exceso, est embarazada, tiene sobrepeso, diabetes, hepatitis o altos niveles de triglicridos o de colesterol. Comunquese con el mdico si tiene problemas para controlar su azcar en la sangre, el colesterol o el consumo de alcohol. Esta informacin no tiene Theme park manager el consejo del mdico. Asegrese de hacerle al mdico cualquier pregunta que tenga. Document Revised: 08/07/2020 Document Reviewed: 08/07/2020 Elsevier Patient Education  2022 [employer].

## 2021-07-03 NOTE — Progress Notes (Signed)
   Subjective:    Patient ID: Jamie Torres, female    DOB: 12-21-1955, 65 y.o.   MRN: [id number]  HPI Pt here to discuss recent labs. Provider would like to discuss cholesterol meds with patient. Pt did have US done on 06/12/21.  We had a long discussion Interpreter was present We discussed hyperlipidemia Hyperlipidemia with severe elevation of LDL puts patient at significant risk of cardiovascular disease risk and benefits was discussed regarding medicine time for patient to give any questions.  She consents to utilizing medication we will do follow-up labs in 8 weeks  Fatty liver Fatty liver discussed in detail the importance of keeping weight and check ultrasound results discussed.  Although not worrisome it is still concerning that she does have fatty liver healthy eating regular activity discussed, see below Mid thoracic back pain Intermittent midthoracic back pain present over the past 2 to 3 weeks hurts with certain movements.  Stretches were shown.  No weight loss fevers no severe cough   Nocturnal cough More than likely this is related to reflux has intermittent nocturnal cough no cough during the day  Review of Systems     Objective:   Physical Exam Lungs clear heart regular pulse normal extremities no edema skin warm dry Abdomen is soft no guarding or rebound liver feels normal      Assessment & Plan:  1. Fatty liver Fatty liver present on ultrasound this is most likely the diagnosis.  On future lab work if liver enzymes stays elevated or goes higher I would recommend additional labs to rule out other potential entities - Lipid Profile - Hepatic function panel  2. Nocturnal cough Nocturnal cough more than likely related to a slight regurgitation patient admits to I have encouraged her to put her head of bed on 6 inch blocks also avoid eating large meals close to bedtime plus also for months of being daily.  We will see how her cough is on follow-up if persistent  consider chest x-ray consider further work-up  3. Gastroesophageal reflux disease without esophagitis Famotidine, dietary measures discussed, head of bed on 6 inch block  4. Midline thoracic back pain, unspecified chronicity More than likely musculoskeletal.  Mainly in the rhomboid muscles bilateral.  Stretches should help was shown to the patient and handout given via the nurse  5. Need for vaccination Flu shot today - Flu Vaccine QUAD High Dose(Fluad)  6. High risk medication use Labs in approximately 8 weeks with follow-up office visit - Lipid Profile - Hepatic function panel  7. Mixed hyperlipidemia Labs in 8 weeks follow-up office visit - Lipid Profile - Hepatic function panel

## 2021-08-20 DIAGNOSIS — Z79899 Other long term (current) drug therapy: Secondary | ICD-10-CM | POA: Diagnosis not present

## 2021-08-20 DIAGNOSIS — K760 Fatty (change of) liver, not elsewhere classified: Secondary | ICD-10-CM | POA: Diagnosis not present

## 2021-08-20 DIAGNOSIS — E782 Mixed hyperlipidemia: Secondary | ICD-10-CM | POA: Diagnosis not present

## 2021-08-21 LAB — LIPID PANEL
Chol/HDL Ratio: 3.9 ratio (ref 0.0–4.4)
Cholesterol, Total: 137 mg/dL (ref 100–199)
HDL: 35 mg/dL — ABNORMAL LOW (ref >39)
LDL Chol Calc (NIH): 76 mg/dL (ref 0–99)
Triglycerides: 146 mg/dL (ref 0–149)
VLDL Cholesterol Cal: 26 mg/dL (ref 5–40)

## 2021-08-21 LAB — HEPATIC FUNCTION PANEL
ALT: 36 IU/L — ABNORMAL HIGH (ref 0–32)
AST: 27 IU/L (ref 0–40)
Albumin: 4.2 g/dL (ref 3.8–4.8)
Alkaline Phosphatase: 91 IU/L (ref 44–121)
Bilirubin Total: 0.3 mg/dL (ref 0.0–1.2)
Bilirubin, Direct: <0.1 mg/dL (ref 0.00–0.40)
Total Protein: 7.4 g/dL (ref 6.0–8.5)

## 2021-08-28 ENCOUNTER — Ambulatory Visit: Payer: Medicare Other | Admitting: Family Medicine

## 2021-09-17 ENCOUNTER — Ambulatory Visit (INDEPENDENT_AMBULATORY_CARE_PROVIDER_SITE_OTHER): Payer: Medicare Other | Admitting: Family Medicine

## 2021-09-17 ENCOUNTER — Other Ambulatory Visit: Payer: Self-pay

## 2021-09-17 DIAGNOSIS — I10 Essential (primary) hypertension: Secondary | ICD-10-CM | POA: Diagnosis not present

## 2021-09-17 DIAGNOSIS — K760 Fatty (change of) liver, not elsewhere classified: Secondary | ICD-10-CM

## 2021-09-17 DIAGNOSIS — E039 Hypothyroidism, unspecified: Secondary | ICD-10-CM | POA: Diagnosis not present

## 2021-09-17 DIAGNOSIS — E782 Mixed hyperlipidemia: Secondary | ICD-10-CM | POA: Diagnosis not present

## 2021-09-17 NOTE — Assessment & Plan Note (Signed)
Stable. Will continue to monitor closely. Recommend [place] every 3-4 years.

## 2021-09-17 NOTE — Patient Instructions (Signed)
Lo ests [employer]. Los laboratorios han [employer]. Contine con sus medicamentos. Te ver en 6 meses.  Dr. Adriana Simas

## 2021-09-17 NOTE — Assessment & Plan Note (Signed)
At goal. Continue lisinopril.  

## 2021-09-17 NOTE — Assessment & Plan Note (Signed)
Dramatic improvement in lipids.  I am very pleased with her results.  Continue Crestor 10 mg daily.

## 2021-09-17 NOTE — Progress Notes (Signed)
Subjective:  Patient ID: Jamie Torres, female    DOB: 21-May-1956  Age: 65 y.o. MRN: [id number]  CC: Chief Complaint  Patient presents with   Follow-up    Follow up on recent labs Establish care-no problems or concerns    HPI:  65 year old female with hypertension, hypothyroidism, mixed hyperlipidemia, hepatic steatosis presents for follow-up regarding recent laboratory studies.  Interpreter present.  Hepatic steatosis Liver enzymes stable.  Right upper quadrant ultrasound was done and revealed hepatic steatosis. Was previously educated on lifestyle and dietary changes as well as exercise.  Hyperlipidemia Dramatic improvement in lipids following addition of rosuvastatin.  She is tolerating well. LDL 76.  Hypertension At goal.  She is tolerating lisinopril well.  Patient Active Problem List   Diagnosis Date Noted   Hepatic steatosis 09/17/2021   Essential hypertension 11/13/2020   Vitamin D deficiency 11/14/2015   Chronic insomnia 08/26/2014   Mixed hyperlipidemia 05/25/2013   Hypothyroidism 09/02/2010    Social Hx   Social History   Socioeconomic History   Marital status: Married    Spouse name: Not on file   Number of children: Not on file   Years of education: Not on file   Highest education level: Not on file  Occupational History   Not on file  Tobacco Use   Smoking status: Never   Smokeless tobacco: Never  Vaping Use   Vaping Use: Never used  Substance and Sexual Activity   Alcohol use: No   Drug use: No   Sexual activity: Not on file  Other Topics Concern   Not on file  Social History Narrative   Not on file   Social Determinants of Health   Financial Resource Strain: Not on file  Food Insecurity: Not on file  Transportation Needs: Not on file  Physical Activity: Not on file  Stress: Not on file  Social Connections: Not on file    Review of Systems  Constitutional: Negative.     Objective:  BP 128/82   Pulse 67   Temp (!) 97.2 F  (36.2 C) (Oral)   Ht 5' (1.524 m)   Wt 141 lb (64 kg)   SpO2 99%   BMI 27.54 kg/m   BP/Weight 09/17/2021 07/03/2021 05/23/2021  Systolic BP 128 119 108  Diastolic BP 82 80 70  Wt. (Lbs) 141 141.6 139.2  BMI 27.54 27.65 27.19    Physical Exam Constitutional:      Appearance: Normal appearance.  HENT:     Head: Normocephalic and atraumatic.  Cardiovascular:     Rate and Rhythm: Normal rate and regular rhythm.  Pulmonary:     Effort: Pulmonary effort is normal.     Breath sounds: Normal breath sounds. No wheezing, rhonchi or rales.  Abdominal:     General: There is no distension.     Palpations: Abdomen is soft.     Tenderness: no abdominal tenderness  Neurological:     Mental Status: She is alert.  Psychiatric:        Mood and Affect: Mood normal.        Behavior: Behavior normal.    Lab Results  Component Value Date   WBC 5.2 11/09/2020   HGB 15.4 (H) 11/09/2020   HCT 47.7 (H) 11/09/2020   PLT 231 11/09/2020   GLUCOSE 102 (H) 05/28/2021   CHOL 137 08/20/2021   TRIG 146 08/20/2021   HDL 35 (L) 08/20/2021   LDLCALC 76 08/20/2021   ALT 36 (H) 08/20/2021  AST 27 08/20/2021   NA 138 05/28/2021   K 4.4 05/28/2021   CL 104 05/28/2021   CREATININE 0.80 05/28/2021   BUN 20 05/28/2021   CO2 20 05/28/2021   TSH 3.250 02/27/2021     Assessment & Plan:   Problem List Items Addressed This Visit       Cardiovascular and Mediastinum   Essential hypertension    At goal.  Continue lisinopril.        Digestive   Hepatic steatosis    Stable. Will continue to monitor closely. Recommend [place] every 3-4 years.        Endocrine   Hypothyroidism     Other   Mixed hyperlipidemia    Dramatic improvement in lipids.  I am very pleased with her results.  Continue Crestor 10 mg daily.       Follow-up:  Return in about 6 months (around 03/18/2022) for Follow up Chronic medical issues.  Everlene Other DO [facility]

## 2021-10-17 ENCOUNTER — Telehealth: Payer: Self-pay | Admitting: Family Medicine

## 2021-10-17 NOTE — Telephone Encounter (Signed)
°  Left message for patient to call back and schedule Medicare Annual Wellness Visit (AWV) in office.   If unable to come into the office for AWV,  please offer to do virtually or by telephone.  No hx of AWV eligible for AWVI as of 11/13/2020 per [facility]  Please schedule at anytime with [facility]Nurse Health Advisor.      40 Minutes appointment   Any questions, please call me at [phone]

## 2021-11-10 ENCOUNTER — Other Ambulatory Visit: Payer: Self-pay | Admitting: Family Medicine

## 2021-12-21 ENCOUNTER — Other Ambulatory Visit: Payer: Self-pay

## 2021-12-21 ENCOUNTER — Ambulatory Visit (HOSPITAL_COMMUNITY)
Admission: EM | Admit: 2021-12-21 | Discharge: 2021-12-21 | Disposition: A | Discharge status: Home / Self Care | Payer: Medicare Other

## 2021-12-21 ENCOUNTER — Encounter (HOSPITAL_COMMUNITY): Payer: Self-pay | Admitting: *Deleted

## 2021-12-21 DIAGNOSIS — A084 Viral intestinal infection, unspecified: Secondary | ICD-10-CM

## 2021-12-21 NOTE — ED Triage Notes (Signed)
Pt was in [place] 2 weeks and had same Sx's . Pt was treated while in [place]. Pt reports Sx's di resolve while in [place] .Pt has been back in [place] for one week and Sx's have returned. ?

## 2021-12-21 NOTE — ED Triage Notes (Signed)
FAmily reports PT had diarrhea,fever,chills and ABD pain that started last night. ?

## 2021-12-21 NOTE — Discharge Instructions (Addendum)
Your symptoms are most likely secondary to a stomach bug.  Please be sure you are drinking plenty of fluids.  If your symptoms persist more than 7 days, please schedule an appointment with your PCP.  If your symptoms worsen, please go to the ER. ?

## 2021-12-21 NOTE — ED Provider Notes (Signed)
?[facility] ? ? ? ?CSN: [id number] ?Arrival date & time: 12/21/21  1657 ? ? ?  ? ?History   ?Chief Complaint ?Chief Complaint  ?Patient presents with  ? Diarrhea  ? Fever  ? Abdominal Pain  ? Chills  ? ? ?HPI ?Jamie Torres is a 66 y.o. female.  ? ?Patient presents with female family member who is serving as Ecologistmedical interpreter.  She declines a medical interpreter today.  They report 1 day history of fevers, body aches chills, abdominal pain, and diarrhea.  The diarrhea is watery and non-bloody.  She is having abdominal pain, however no nausea or vomiting.  Her appetite is decreased, however she is drinking plenty of liquids and eating soup.  She recently returned from a trip to [place] where she had similar symptoms.  She was treated with an antibiotic and got better.  She does not recall what the antibiotic was called.  She has taken Imodium today with some relief of symptoms.  ? ? ? ? ?Past Medical History:  ?Diagnosis Date  ? Arthritis   ? Hypercholesteremia   ? Hypertension   ? Hypothyroidism   ? Plantar fasciitis   ? TB (pulmonary tuberculosis)   ? Thyroid disease   ? ? ?Patient Active Problem List  ? Diagnosis Date Noted  ? Hepatic steatosis 09/17/2021  ? Essential hypertension 11/13/2020  ? Vitamin D deficiency 11/14/2015  ? Chronic insomnia 08/26/2014  ? Mixed hyperlipidemia 05/25/2013  ? Hypothyroidism 09/02/2010  ? ? ?Past Surgical History:  ?Procedure Laterality Date  ? 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 EYE SURGERY    ? TUBAL LIGATION    ? ? ?OB History   ? ? Gravida  ?4  ? Para  ?4  ? Term  ?4  ? Preterm  ?   ? AB  ?   ? Living  ?4  ?  ? ? SAB  ?   ? IAB  ?   ? Ectopic  ?   ? Multiple  ?   ? Live Births  ?   ?   ?  ?  ? ? ? ?Home Medications   ? ?Prior to Admission medications   ?Medication Sig Start Date End Date Taking? Authorizing Provider  ?diclofenac (VOLTAREN) 75 MG EC tablet Take 1 tablet (75 mg total) by mouth 2 (two) times daily with a meal. 05/23/21   Luking, Jonna CoupScott A, MD  ?fish  oil-omega-3 fatty acids 1000 MG capsule Take 2 g by mouth daily.    [provider]  ?levothyroxine (SYNTHROID) 75 MCG tablet TAKE 1 TABLET BY MOUTH DAILY 05/23/21   Babs SciaraLuking, Scott A, MD  ?lisinopril (ZESTRIL) 5 MG tablet TAKE 1 TABLET(5 MG) BY MOUTH DAILY 05/23/21   Babs SciaraLuking, Scott A, MD  ?rosuvastatin (CRESTOR) 10 MG tablet TAKE 1 TABLET BY MOUTH DAILY 11/11/21   Tommie Samsook, Jayce G, DO  ? ? ?Family History ?History reviewed. No pertinent family history. ? ?Social History ?Social History  ? ?Tobacco Use  ? Smoking status: Never  ? Smokeless tobacco: Never  ?Vaping Use  ? Vaping Use: Never used  ?Substance Use Topics  ? Alcohol use: No  ? Drug use: No  ? ? ? ?Allergies   ?Patient has no known allergies. ? ? ?Review of Systems ?Review of Systems ?Per HPI ? ?Physical Exam ?Triage Vital Signs ?ED Triage Vitals  ?Enc Vitals Group  ?   BP 12/21/21 1743 (!) 142/83  ?   Pulse  Rate 12/21/21 1743 87  ?   Resp 12/21/21 1743 20  ?   Temp 12/21/21 1743 98 ?F (36.7 ?C)  ?   Temp src --   ?   SpO2 12/21/21 1743 97 %  ?   Weight --   ?   Height --   ?   Head Circumference --   ?   Peak Flow --   ?   Pain Score 12/21/21 1741 2  ?   Pain Loc --   ?   Pain Edu? --   ?   Excl. in GC? --   ? ?No data found. ? ?Updated Vital Signs ?BP (!) 142/83   Pulse 87   Temp 98 ?F (36.7 ?C)   Resp 20   SpO2 97%  ? ?Visual Acuity ?Right Eye Distance:   ?Left Eye Distance:   ?Bilateral Distance:   ? ?Right Eye Near:   ?Left Eye Near:    ?Bilateral Near:    ? ?Physical Exam ?Vitals and nursing note reviewed.  ?Constitutional:   ?   General: She is not in acute distress. ?   Appearance: She is not toxic-appearing.  ?HENT:  ?   Head: Normocephalic and atraumatic.  ?   Mouth/Throat:  ?   Mouth: Mucous membranes are moist.  ?   Pharynx: Oropharynx is clear.  ?Eyes:  ?   General: No scleral icterus. ?   Extraocular Movements: Extraocular movements intact.  ?   Pupils: Pupils are equal, round, and reactive to light.  ?Cardiovascular:  ?   Rate and Rhythm:  Normal rate and regular rhythm.  ?Pulmonary:  ?   Effort: Pulmonary effort is normal. No respiratory distress.  ?   Breath sounds: Normal breath sounds. No wheezing, rhonchi or rales.  ?Abdominal:  ?   General: Abdomen is flat. Bowel sounds are normal.  ?   Palpations: Abdomen is soft. There is no mass.  ?   Tenderness: There is no abdominal tenderness. Negative signs include Murphy's sign and McBurney's sign.  ?Genitourinary: ?   Comments: Deferred ?Skin: ?   General: Skin is warm and dry.  ?   Capillary Refill: Capillary refill takes less than 2 seconds.  ?   Coloration: Skin is not cyanotic, jaundiced or pale.  ?Neurological:  ?   General: No focal deficit present.  ?   Mental Status: She is alert and oriented to person, place, and time.  ?   Motor: No weakness.  ?Psychiatric:     ?   Mood and Affect: Mood normal.     ?   Behavior: Behavior normal.  ? ? ? ?UC Treatments / Results  ?Labs ?(all labs ordered are listed, but only abnormal results are displayed) ?Labs Reviewed - No data to display ? ?EKG ? ? ?Radiology ?No results found. ? ?Procedures ?Procedures (including critical care time) ? ?Medications Ordered in UC ?Medications - No data to display ? ?Initial Impression / Assessment and Plan / UC Course  ?I have reviewed the triage vital signs and the nursing notes. ? ?Pertinent labs & imaging results that were available during my care of the patient were reviewed by me and considered in my medical decision making (see chart for details). ? ? Examination and history consistent with viral gastroenteritis.  Push fluids.  Can use Tylenol/ibuprofen for headache.  Follow up with PCP if symptoms persist.  If symptoms worsen, go to the ER. ?Final Clinical Impressions(s) / UC Diagnoses  ? ?Final diagnoses:  ?  Viral gastroenteritis  ? ? ? ?Discharge Instructions   ? ?  ?Your symptoms are most likely secondary to a stomach bug.  Please be sure you are drinking plenty of fluids.  If your symptoms persist more than 7 days,  please schedule an appointment with your PCP.  If your symptoms worsen, please go to the ER. ? ? ? ? ?ED Prescriptions   ?None ?  ? ?PDMP not reviewed this encounter. ?  ?Valentino Nose, NP ?12/21/21 1837 ? ?

## 2022-01-06 ENCOUNTER — Other Ambulatory Visit: Payer: Self-pay

## 2022-01-06 DIAGNOSIS — M25541 Pain in joints of right hand: Secondary | ICD-10-CM

## 2022-01-06 MED ORDER — DICLOFENAC SODIUM 75 MG PO TBEC: 75.0000 mg | DELAYED_RELEASE_TABLET | Freq: Two times a day (BID) | ORAL | 0 refills | Status: DC

## 2022-01-27 ENCOUNTER — Other Ambulatory Visit: Payer: Self-pay

## 2022-01-27 DIAGNOSIS — I10 Essential (primary) hypertension: Secondary | ICD-10-CM

## 2022-01-27 MED ORDER — LISINOPRIL 5 MG PO TABS: ORAL_TABLET | ORAL | 1 refills | Status: DC

## 2022-02-06 ENCOUNTER — Telehealth: Payer: Self-pay

## 2022-02-06 ENCOUNTER — Other Ambulatory Visit: Payer: Self-pay | Admitting: *Deleted

## 2022-02-06 DIAGNOSIS — E039 Hypothyroidism, unspecified: Secondary | ICD-10-CM

## 2022-02-06 MED ORDER — LEVOTHYROXINE SODIUM 75 MCG PO TABS: ORAL_TABLET | ORAL | 1 refills | Status: DC

## 2022-02-06 MED ORDER — ROSUVASTATIN CALCIUM 10 MG PO TABS: ORAL_TABLET | ORAL | 3 refills | Status: DC

## 2022-02-06 NOTE — Telephone Encounter (Signed)
Encourage patient to contact the pharmacy for refills or they can request refills through [facility] ? ?(Please schedule appointment if patient has not been seen in over a year) ? ? ? ?WHAT PHARMACY WOULD THEY LIKE THIS SENT TO: [facility] - [place], [place] - [address]  ? ?MEDICATION NAME & DOSE:levothyroxine (SYNTHROID) 75 MCG tablet ,rosuvastatin (CRESTOR) 10 MG tablet  ? ?NOTES/COMMENTS FROM PATIENT:lisinopril (ZESTRIL) 5 MG tablet  Pharmacy said they did not received on 04/17 this one needs to be resent as well  ? ? ? ? ? ?Front office please notify patient: ?It takes 48-72 hours to process rx refill requests ?Ask patient to call pharmacy to ensure rx is ready before heading there.  ? ?

## 2022-02-06 NOTE — Telephone Encounter (Signed)
Refills sent in to pharmacy 

## 2022-03-18 ENCOUNTER — Ambulatory Visit (INDEPENDENT_AMBULATORY_CARE_PROVIDER_SITE_OTHER): Payer: Medicare Other | Admitting: Family Medicine

## 2022-03-18 VITALS — BP 116/72 | HR 71 | Temp 97.9°F | Ht 60.0 in | Wt 140.0 lb

## 2022-03-18 DIAGNOSIS — I10 Essential (primary) hypertension: Secondary | ICD-10-CM

## 2022-03-18 DIAGNOSIS — M545 Low back pain, unspecified: Secondary | ICD-10-CM | POA: Insufficient documentation

## 2022-03-18 DIAGNOSIS — R251 Tremor, unspecified: Secondary | ICD-10-CM | POA: Diagnosis not present

## 2022-03-18 DIAGNOSIS — Z130 Encounter for screening for diseases of the blood and blood-forming organs and certain disorders involving the immune mechanism: Secondary | ICD-10-CM | POA: Diagnosis not present

## 2022-03-18 DIAGNOSIS — E782 Mixed hyperlipidemia: Secondary | ICD-10-CM | POA: Diagnosis not present

## 2022-03-18 DIAGNOSIS — K760 Fatty (change of) liver, not elsewhere classified: Secondary | ICD-10-CM | POA: Diagnosis not present

## 2022-03-18 DIAGNOSIS — E039 Hypothyroidism, unspecified: Secondary | ICD-10-CM

## 2022-03-18 MED ORDER — AMLODIPINE BESYLATE 2.5 MG PO TABS: 2.5000 mg | ORAL_TABLET | Freq: Every day | ORAL | 1 refills | Status: DC

## 2022-03-18 NOTE — Progress Notes (Signed)
Subjective:  Patient ID: Jamie Torres, female    DOB: 1956/06/12  Age: 66 y.o. MRN: [id number]  CC: Chief Complaint  Patient presents with   6 month follow up     HTN, Thyroid disease, HLD   left low back/ hip pain   Tremors    Noted on left hand , worse with grasping or holding, body tremors worse in am with waking    HPI:  66 year old female with hypertension, hepatic steatosis, hypothyroidism, hyperlipidemia presents for follow-up.  She has multiple issues/concerns today.  Patient reports that she has had ongoing left-sided low back pain for the past 3 weeks.  No fall, trauma, injury.  No radicular symptoms.  Patient reports that she has had a tremor to her left hand for the past year.  He states that she also has times where she feels like her whole body shakes.  This particular happens when she gets up in the morning.  She does not have a resting tremor.  She reports that her tremor occurs with use/activity.  Denies any weakness.  No difficulties with ambulation.  No vision changes.  Patient also reports that she has had an ongoing cough.  Cough is dry.  Seems to be more troublesome at night.  Patient is on lisinopril.  She states that this has been going on for at least a year.  Patient's hypertension is stable on lisinopril 5 mg daily.  Patient's lipids have been well controlled on Crestor.  Patient needs TSH to assess for hypothyroidism.  She is currently on 75 mcg of Synthroid daily.  Patient Active Problem List   Diagnosis Date Noted   Left-sided low back pain without sciatica 03/18/2022   Tremor 03/18/2022   Hepatic steatosis 09/17/2021   Essential hypertension 11/13/2020   Mixed hyperlipidemia 05/25/2013   Hypothyroidism 09/02/2010    Social Hx   Social History   Socioeconomic History   Marital status: Married    Spouse name: Not on file   Number of children: Not on file   Years of education: Not on file   Highest education level: Not on file   Occupational History   Not on file  Tobacco Use   Smoking status: Never   Smokeless tobacco: Never  Vaping Use   Vaping Use: Never used  Substance and Sexual Activity   Alcohol use: No   Drug use: No   Sexual activity: Not on file  Other Topics Concern   Not on file  Social History Narrative   Not on file   Social Determinants of Health   Financial Resource Strain: Not on file  Food Insecurity: Not on file  Transportation Needs: Not on file  Physical Activity: Not on file  Stress: Not on file  Social Connections: Not on file    Review of Systems Per HPI  Objective:  BP 116/72   Pulse 71   Temp 97.9 F (36.6 C)   Ht 5' (1.524 m)   Wt 140 lb (63.5 kg)   SpO2 99%   BMI 27.34 kg/m      03/18/2022    9:57 AM 12/21/2021    5:43 PM 09/17/2021    9:58 AM  BP/Weight  Systolic BP 982 641 583  Diastolic BP 72 83 82  Wt. (Lbs) 140  141  BMI 27.34 kg/m2  27.54 kg/m2    Physical Exam Constitutional:      General: She is not in acute distress.    Appearance: Normal appearance. She  is not ill-appearing.  HENT:     Head: Normocephalic and atraumatic.  Cardiovascular:     Rate and Rhythm: Normal rate and regular rhythm.  Pulmonary:     Effort: Pulmonary effort is normal.     Breath sounds: Normal breath sounds. No wheezing, rhonchi or rales.  Musculoskeletal:     Comments: No discrete tenderness of the lumbar spine.  Neurological:     Mental Status: She is alert.     Comments: No appreciable weakness or tremor on exam.  Psychiatric:        Mood and Affect: Mood normal.        Behavior: Behavior normal.    Lab Results  Component Value Date   WBC 5.2 11/09/2020   HGB 15.4 (H) 11/09/2020   HCT 47.7 (H) 11/09/2020   PLT 231 11/09/2020   GLUCOSE 102 (H) 05/28/2021   CHOL 137 08/20/2021   TRIG 146 08/20/2021   HDL 35 (L) 08/20/2021   LDLCALC 76 08/20/2021   ALT 36 (H) 08/20/2021   AST 27 08/20/2021   NA 138 05/28/2021   K 4.4 05/28/2021   CL 104  05/28/2021   CREATININE 0.80 05/28/2021   BUN 20 05/28/2021   CO2 20 05/28/2021   TSH 3.250 02/27/2021     Assessment & Plan:   Problem List Items Addressed This Visit       Cardiovascular and Mediastinum   Essential hypertension - Primary    Been well controlled.  Stopping lisinopril due to ongoing cough as this may be the culprit.  Placing on amlodipine.       Relevant Medications   amLODipine (NORVASC) 2.5 MG tablet     Digestive   Hepatic steatosis   Relevant Orders   CMP14+EGFR     Endocrine   Hypothyroidism    TSH today to assess.       Relevant Orders   TSH     Other   Mixed hyperlipidemia    Has been well controlled on Crestor.  Continue.  Lipid panel ordered.       Relevant Medications   amLODipine (NORVASC) 2.5 MG tablet   Other Relevant Orders   Lipid panel   Left-sided low back pain without sciatica    X-ray today for further evaluation.       Relevant Orders   DG Lumbar Spine Complete   Tremor    No tremor noted on exam.  Referring to neurology for further evaluation.       Relevant Orders   Ambulatory referral to Neurology   Other Visit Diagnoses     Screening for deficiency anemia       Relevant Orders   CBC       Meds ordered this encounter  Medications   amLODipine (NORVASC) 2.5 MG tablet    Sig: Take 1 tablet (2.5 mg total) by mouth daily.    Dispense:  90 tablet    Refill:  1    Follow-up:  Return in about 6 months (around 09/17/2022).  Jayce Cook DO [facility]  

## 2022-03-18 NOTE — Assessment & Plan Note (Signed)
TSH today to assess. 

## 2022-03-18 NOTE — Assessment & Plan Note (Signed)
Been well controlled.  Stopping lisinopril due to ongoing cough as this may be the culprit.  Placing on amlodipine.

## 2022-03-18 NOTE — Patient Instructions (Signed)
Stop lisinopril. Start Amlodipine.  X-ray at the hospital.  I have placed a referral for neurology.  Labs today.  Follow up in 6 months or sooner if needed.  We will call with results.

## 2022-03-18 NOTE — Assessment & Plan Note (Signed)
Has been well controlled on Crestor.  Continue.  Lipid panel ordered.

## 2022-03-18 NOTE — Assessment & Plan Note (Signed)
No tremor noted on exam.  Referring to neurology for further evaluation.

## 2022-03-18 NOTE — Assessment & Plan Note (Signed)
X-ray today for further evaluation. 

## 2022-03-20 DIAGNOSIS — Z130 Encounter for screening for diseases of the blood and blood-forming organs and certain disorders involving the immune mechanism: Secondary | ICD-10-CM | POA: Diagnosis not present

## 2022-03-20 DIAGNOSIS — K760 Fatty (change of) liver, not elsewhere classified: Secondary | ICD-10-CM | POA: Diagnosis not present

## 2022-03-20 DIAGNOSIS — E782 Mixed hyperlipidemia: Secondary | ICD-10-CM | POA: Diagnosis not present

## 2022-03-20 DIAGNOSIS — E039 Hypothyroidism, unspecified: Secondary | ICD-10-CM | POA: Diagnosis not present

## 2022-03-21 ENCOUNTER — Other Ambulatory Visit: Payer: Self-pay | Admitting: Family Medicine

## 2022-03-21 DIAGNOSIS — E039 Hypothyroidism, unspecified: Secondary | ICD-10-CM

## 2022-03-21 LAB — LIPID PANEL
Chol/HDL Ratio: 3.9 ratio (ref 0.0–4.4)
Cholesterol, Total: 134 mg/dL (ref 100–199)
HDL: 34 mg/dL — ABNORMAL LOW (ref >39)
LDL Chol Calc (NIH): 76 mg/dL (ref 0–99)
Triglycerides: 133 mg/dL (ref 0–149)
VLDL Cholesterol Cal: 24 mg/dL (ref 5–40)

## 2022-03-21 LAB — CMP14+EGFR
ALT: 24 IU/L (ref 0–32)
AST: 20 IU/L (ref 0–40)
Albumin/Globulin Ratio: 1.4 (ref 1.2–2.2)
Albumin: 4.2 g/dL (ref 3.8–4.8)
Alkaline Phosphatase: 89 IU/L (ref 44–121)
BUN/Creatinine Ratio: 21 (ref 12–28)
BUN: 16 mg/dL (ref 8–27)
Bilirubin Total: 0.3 mg/dL (ref 0.0–1.2)
CO2: 21 mmol/L (ref 20–29)
Calcium: 9.2 mg/dL (ref 8.7–10.3)
Chloride: 109 mmol/L — ABNORMAL HIGH (ref 96–106)
Creatinine, Ser: 0.75 mg/dL (ref 0.57–1.00)
Globulin, Total: 3 g/dL (ref 1.5–4.5)
Glucose: 109 mg/dL — ABNORMAL HIGH (ref 70–99)
Potassium: 4.3 mmol/L (ref 3.5–5.2)
Sodium: 142 mmol/L (ref 134–144)
Total Protein: 7.2 g/dL (ref 6.0–8.5)
eGFR: 87 mL/min/{1.73_m2} (ref >59)

## 2022-03-21 LAB — TSH: TSH: 9.28 u[IU]/mL — ABNORMAL HIGH (ref 0.450–4.500)

## 2022-03-21 LAB — CBC
Hematocrit: 39.1 % (ref 34.0–46.6)
Hemoglobin: 13 g/dL (ref 11.1–15.9)
MCH: 29.1 pg (ref 26.6–33.0)
MCHC: 33.2 g/dL (ref 31.5–35.7)
MCV: 88 fL (ref 79–97)
Platelets: 283 10*3/uL (ref 150–450)
RBC: 4.47 x10E6/uL (ref 3.77–5.28)
RDW: 13.9 % (ref 11.7–15.4)
WBC: 6.8 10*3/uL (ref 3.4–10.8)

## 2022-03-21 MED ORDER — LEVOTHYROXINE SODIUM 88 MCG PO TABS: 88.0000 ug | ORAL_TABLET | Freq: Every day | ORAL | 1 refills | Status: DC

## 2022-03-24 ENCOUNTER — Ambulatory Visit (HOSPITAL_COMMUNITY)
Admission: RE | Admit: 2022-03-24 | Discharge: 2022-03-24 | Disposition: A | Discharge status: Home / Self Care | Payer: Medicare Other | Source: Ambulatory Visit | Attending: Family Medicine | Admitting: Family Medicine

## 2022-03-24 DIAGNOSIS — M545 Low back pain, unspecified: Secondary | ICD-10-CM | POA: Insufficient documentation

## 2022-04-01 DIAGNOSIS — R1310 Dysphagia, unspecified: Secondary | ICD-10-CM | POA: Diagnosis not present

## 2022-04-01 DIAGNOSIS — K219 Gastro-esophageal reflux disease without esophagitis: Secondary | ICD-10-CM | POA: Diagnosis not present

## 2022-04-09 ENCOUNTER — Other Ambulatory Visit: Payer: Self-pay

## 2022-04-09 DIAGNOSIS — M25541 Pain in joints of right hand: Secondary | ICD-10-CM

## 2022-04-09 MED ORDER — DICLOFENAC SODIUM 75 MG PO TBEC: 75.0000 mg | DELAYED_RELEASE_TABLET | Freq: Two times a day (BID) | ORAL | 0 refills | Status: DC

## 2022-04-30 ENCOUNTER — Other Ambulatory Visit (HOSPITAL_COMMUNITY): Payer: Self-pay | Admitting: Family Medicine

## 2022-04-30 DIAGNOSIS — Z1231 Encounter for screening mammogram for malignant neoplasm of breast: Secondary | ICD-10-CM

## 2022-05-05 DIAGNOSIS — K449 Diaphragmatic hernia without obstruction or gangrene: Secondary | ICD-10-CM | POA: Diagnosis not present

## 2022-05-05 DIAGNOSIS — K3189 Other diseases of stomach and duodenum: Secondary | ICD-10-CM | POA: Diagnosis not present

## 2022-05-05 DIAGNOSIS — K2289 Other specified disease of esophagus: Secondary | ICD-10-CM | POA: Diagnosis not present

## 2022-05-05 DIAGNOSIS — K222 Esophageal obstruction: Secondary | ICD-10-CM | POA: Diagnosis not present

## 2022-05-05 DIAGNOSIS — R1310 Dysphagia, unspecified: Secondary | ICD-10-CM | POA: Diagnosis not present

## 2022-05-05 DIAGNOSIS — K2930 Chronic superficial gastritis without bleeding: Secondary | ICD-10-CM | POA: Diagnosis not present

## 2022-05-12 DIAGNOSIS — K2930 Chronic superficial gastritis without bleeding: Secondary | ICD-10-CM | POA: Diagnosis not present

## 2022-05-12 DIAGNOSIS — K2289 Other specified disease of esophagus: Secondary | ICD-10-CM | POA: Diagnosis not present

## 2022-05-28 ENCOUNTER — Ambulatory Visit (HOSPITAL_COMMUNITY)
Admission: RE | Admit: 2022-05-28 | Discharge: 2022-05-28 | Disposition: A | Discharge status: Home / Self Care | Payer: Medicare Other | Source: Ambulatory Visit | Attending: Family Medicine | Admitting: Family Medicine

## 2022-05-28 DIAGNOSIS — Z1231 Encounter for screening mammogram for malignant neoplasm of breast: Secondary | ICD-10-CM | POA: Diagnosis not present

## 2022-06-19 ENCOUNTER — Other Ambulatory Visit: Payer: Self-pay | Admitting: *Deleted

## 2022-06-19 MED ORDER — ROSUVASTATIN CALCIUM 10 MG PO TABS: ORAL_TABLET | ORAL | 0 refills | Status: DC

## 2022-07-15 ENCOUNTER — Telehealth: Payer: Self-pay | Admitting: Family Medicine

## 2022-07-15 NOTE — Telephone Encounter (Signed)
No answer unable to leave a message for patient to call back and schedule Medicare Annual Wellness Visit (AWV).  Please offer to do virtually or by telephone.  No hx of AWV eligible for AWVI per [employer] as of 11/13/2020  Please schedule at any time with [facility]Nurse Health Advisor.      45 minute appointment   Any questions, please call me at [phone]

## 2022-07-22 ENCOUNTER — Other Ambulatory Visit: Payer: Self-pay | Admitting: Family Medicine

## 2022-07-25 ENCOUNTER — Other Ambulatory Visit: Payer: Self-pay

## 2022-07-25 MED ORDER — ROSUVASTATIN CALCIUM 10 MG PO TABS: 10.0000 mg | ORAL_TABLET | Freq: Every day | ORAL | 0 refills | Status: DC

## 2022-08-12 ENCOUNTER — Other Ambulatory Visit: Payer: Self-pay | Admitting: Family Medicine

## 2022-08-12 DIAGNOSIS — I10 Essential (primary) hypertension: Secondary | ICD-10-CM

## 2022-08-14 ENCOUNTER — Other Ambulatory Visit: Payer: Self-pay | Admitting: Family Medicine

## 2022-08-14 DIAGNOSIS — M25541 Pain in joints of right hand: Secondary | ICD-10-CM

## 2022-08-15 IMAGING — CT CT HEAD W/O CM
3 series · 16 of 46 positions shown, 19 images · non-contrast
Comparison: October 21, 2012

CLINICAL DATA: Headache

EXAM:
CT HEAD WITHOUT CONTRAST
TECHNIQUE: Contiguous axial images were obtained from the base of the skull
through the vertex without intravenous contrast.

[Series 2: head w o · axial · 0.40mm/px · z∈[+95,+215]mm · 10 of 29 slices shown, 13 images]
[im 3/29  brain]
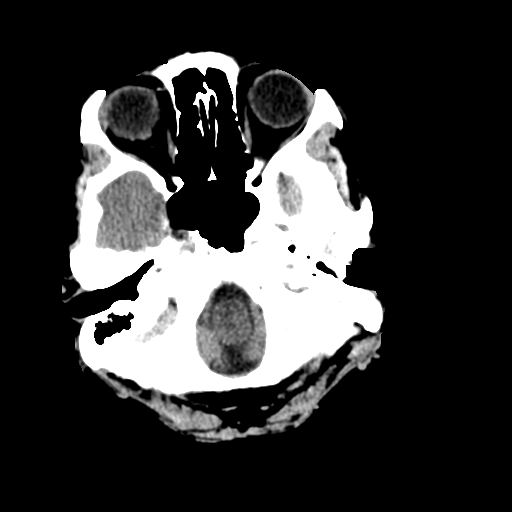
[im 3/29  bone]
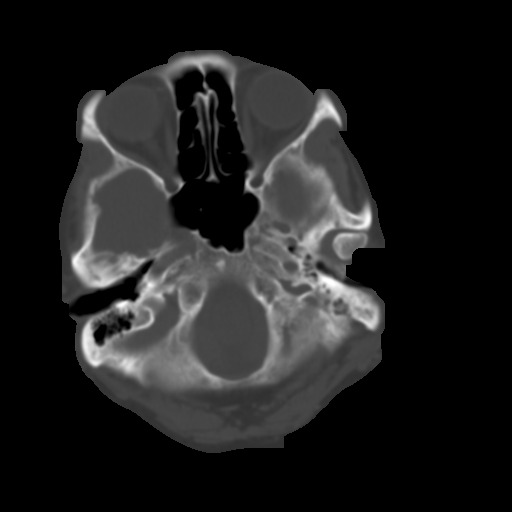
[im 6/29  brain]
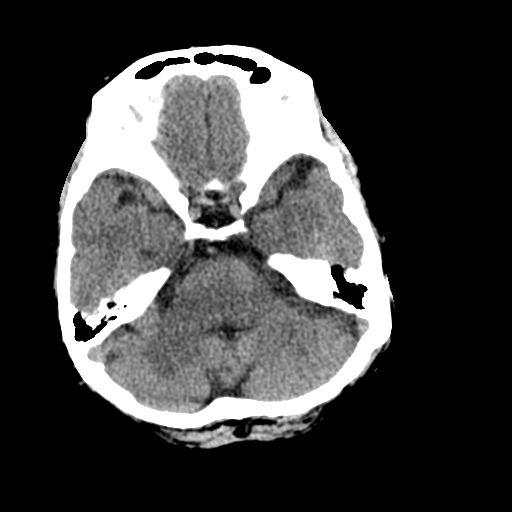
[im 8/29  brain]
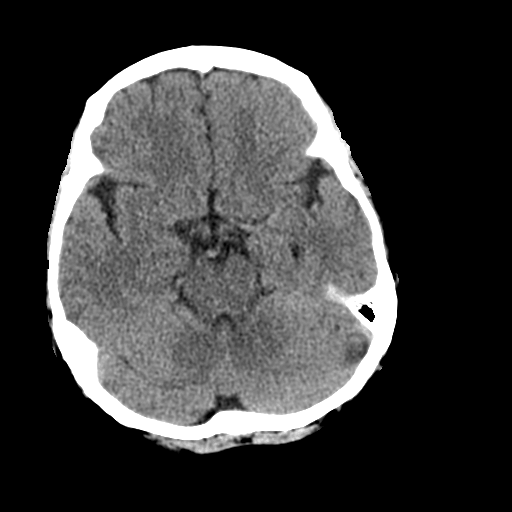
[im 11/29  brain]
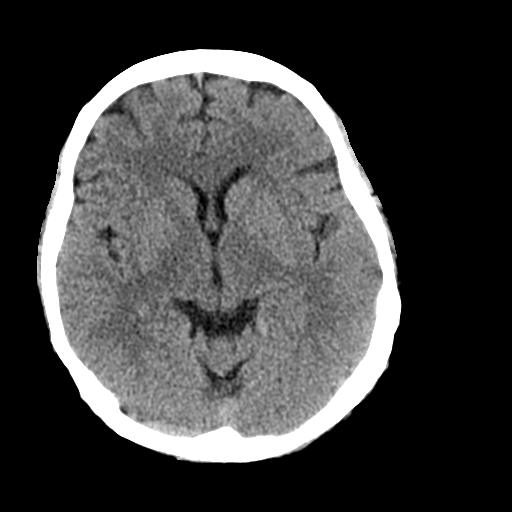
[im 14/29  brain]
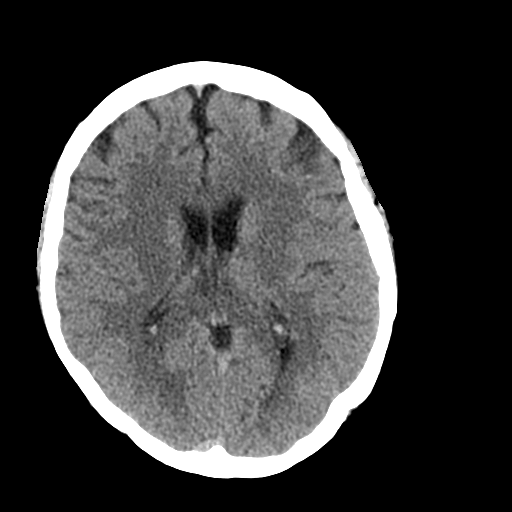
[im 14/29  bone]
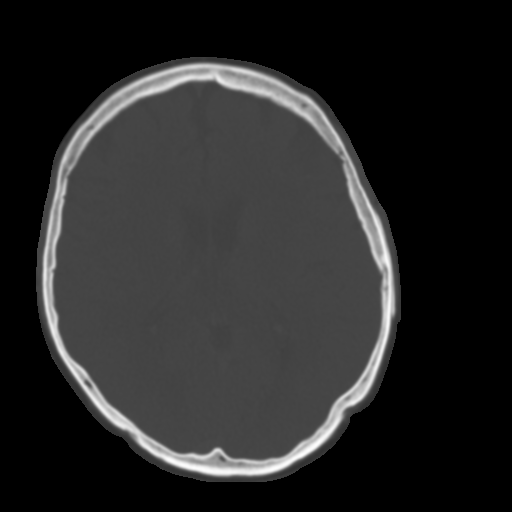
[im 16/29  brain]
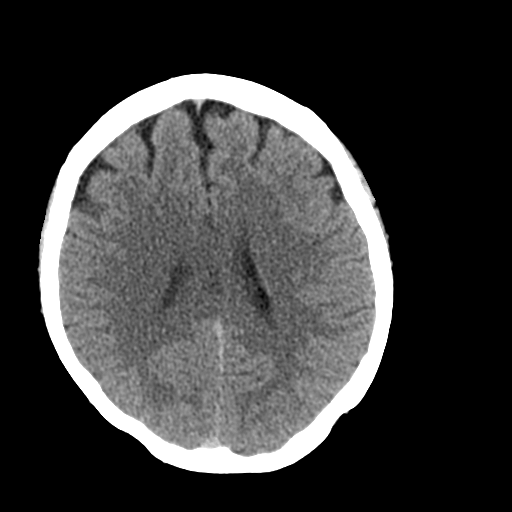
[im 19/29  brain]
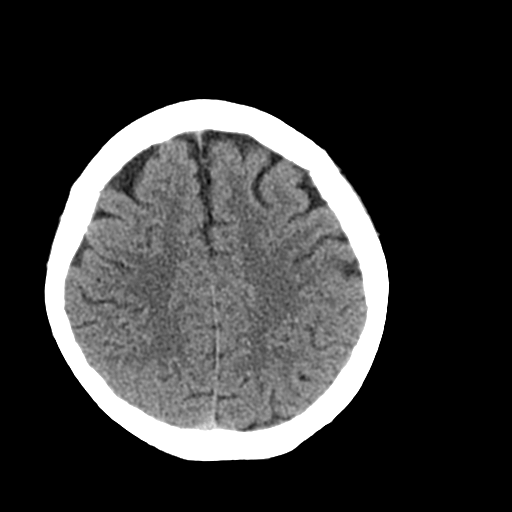
[im 22/29  brain]
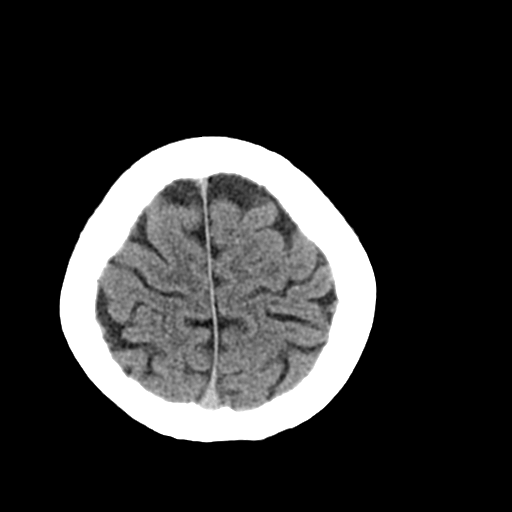
[im 24/29  brain]
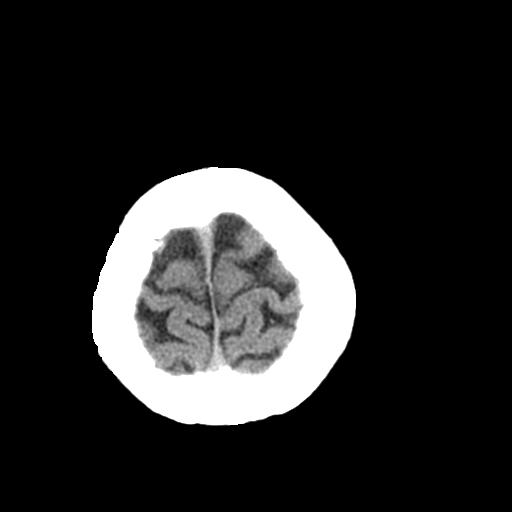
[im 24/29  bone]
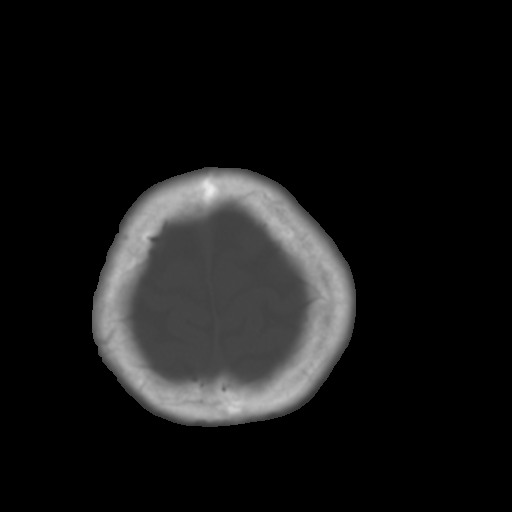
[im 27/29  brain]
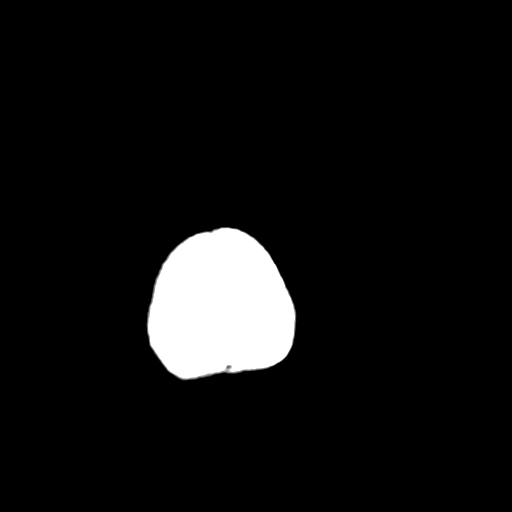

[Series 4: coronal soft · coronal · 0.29mm/px · 3 of 66 slices shown]
[im 22/66  brain]
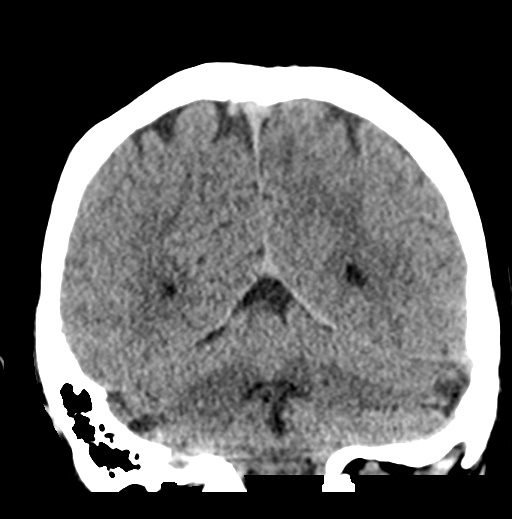
[im 29/66  brain]
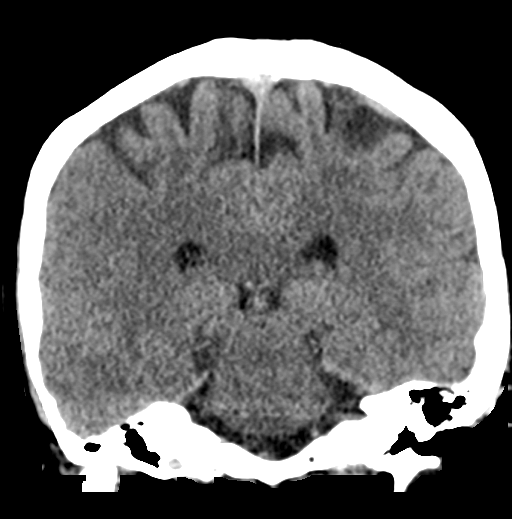
[im 37/66  brain]
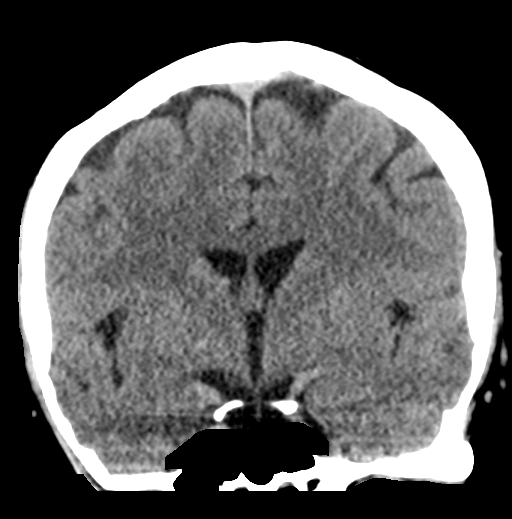

[Series 5: sagittal soft · sagittal · 0.30mm/px · 3 of 59 slices shown]
[im 20/59  brain]
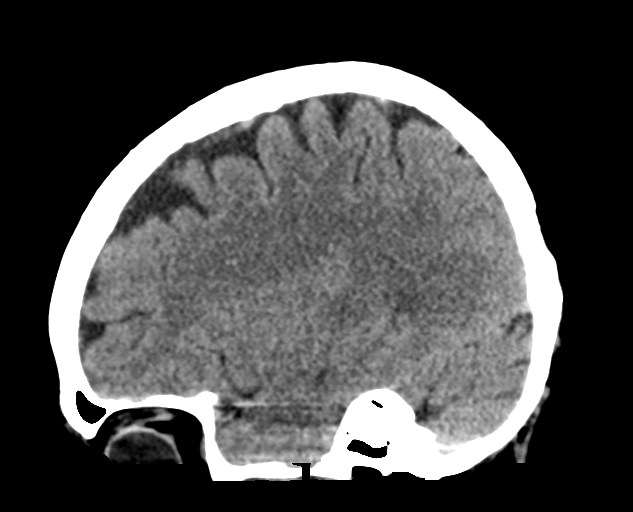
[im 30/59  brain]
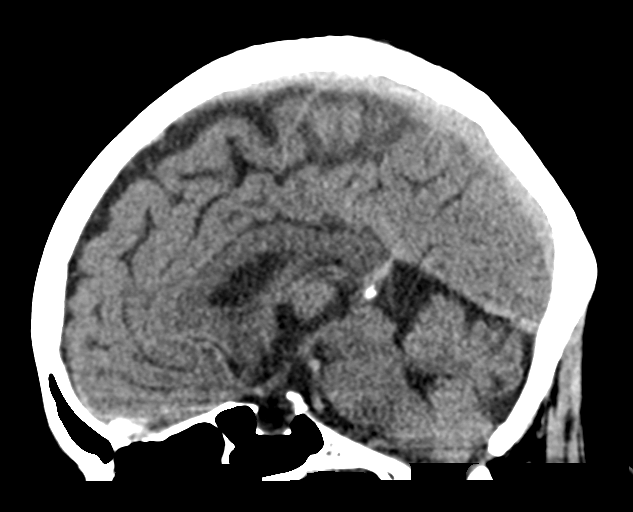
[im 39/59  brain]
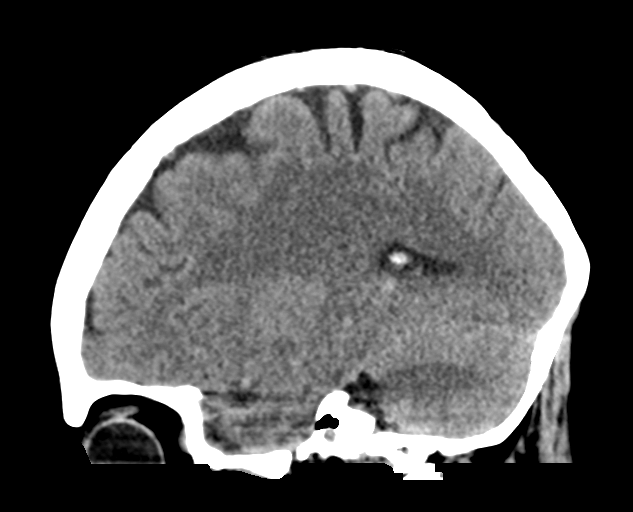

[16 of 46 positions shown; findings below may reference images not displayed]

FINDINGS: Brain: The ventricles and sulci are normal in size and
configuration. There is mild invagination of CSF into the sella.
There is no intracranial mass, hemorrhage, extra-axial fluid
collection, or midline shift. Brain parenchyma appears normal. No
evident acute infarct.

Vascular: No hyperdense vessel.  No evident vascular calcification.

Skull: Bony calvarium appears intact.

Sinuses/Orbits: Visualized paranasal sinuses are clear. Visualized
orbits appear symmetric bilaterally

Other: Mastoid air cells are clear
IMPRESSION: There is mild invagination of CSF into the sella, a finding of
questionable clinical significance. This finding has been associated
with headaches. Ventricles and sulci normal in size and
configuration.

Brain parenchyma appears unremarkable.  No mass or hemorrhage.

## 2022-09-08 ENCOUNTER — Other Ambulatory Visit: Payer: Self-pay | Admitting: Family Medicine

## 2022-09-17 ENCOUNTER — Ambulatory Visit (INDEPENDENT_AMBULATORY_CARE_PROVIDER_SITE_OTHER): Payer: Medicare Other | Admitting: Family Medicine

## 2022-09-17 ENCOUNTER — Encounter: Payer: Self-pay | Admitting: Family Medicine

## 2022-09-17 VITALS — BP 144/79 | HR 61 | Temp 97.0°F | Wt 139.6 lb

## 2022-09-17 DIAGNOSIS — I10 Essential (primary) hypertension: Secondary | ICD-10-CM

## 2022-09-17 DIAGNOSIS — E039 Hypothyroidism, unspecified: Secondary | ICD-10-CM

## 2022-09-17 DIAGNOSIS — N819 Female genital prolapse, unspecified: Secondary | ICD-10-CM

## 2022-09-17 NOTE — Patient Instructions (Addendum)
Laboratorio hoy para revisar tu tiroides.  He realizado una derivacin a OB GYN.  Seguimiento en 6 meses.  Dr. Adriana Simas

## 2022-09-18 DIAGNOSIS — N819 Female genital prolapse, unspecified: Secondary | ICD-10-CM | POA: Insufficient documentation

## 2022-09-18 NOTE — Assessment & Plan Note (Signed)
Referring to GYN. 

## 2022-09-18 NOTE — Assessment & Plan Note (Signed)
TSH today.  Continue current dosing of Synthroid.  May need dose increase or decrease based on TSH levels.

## 2022-09-18 NOTE — Assessment & Plan Note (Signed)
BP mildly elevated today.  Continue amlodipine.

## 2022-09-18 NOTE — Progress Notes (Signed)
Subjective:  Patient ID: Jamie Torres, female    DOB: 03-Jun-1956  Age: 66 y.o. MRN: [id number]  CC: Chief Complaint  Patient presents with   Hypertension    Pt arrives to follow up. No issues.     HPI:  66 year old female with hypertension, hepatic steatosis, hypothyroidism presents for follow-up.  Patient is accompanied by Spanish interpreter today.  Patient reports that for many years now she has felt as if she has had a "cyst" in the vaginal area.  Worse with sitting down.  She feels like it is getting larger.  She states that this has been going on for 30 years but has worsened over the past year.  She denies any pain.  No urinary symptoms.  She states that occasionally when she wipes there will be some blood when she wipes.  Per the chart, patient had a GYN examination in 2020 that was unremarkable.  I am concerned that patient is experiencing prolapse.  Patient's blood pressure is mildly elevated here today.  She is on amlodipine 2.5 mg daily.  Patient's last TSH was elevated at 9.280.  Needs reevaluation of TSH today.  Currently on levothyroxine 88 mcg daily.  Patient Active Problem List   Diagnosis Date Noted   Female genital prolapse 09/18/2022   Hepatic steatosis 09/17/2021   Essential hypertension 11/13/2020   Mixed hyperlipidemia 05/25/2013   Hypothyroidism 09/02/2010    Social Hx   Social History   Socioeconomic History   Marital status: Married    Spouse name: Not on file   Number of children: Not on file   Years of education: Not on file   Highest education level: Not on file  Occupational History   Not on file  Tobacco Use   Smoking status: Never   Smokeless tobacco: Never  Vaping Use   Vaping Use: Never used  Substance and Sexual Activity   Alcohol use: No   Drug use: No   Sexual activity: Not on file  Other Topics Concern   Not on file  Social History Narrative   Not on file   Social Determinants of Health   Financial Resource Strain:  Not on file  Food Insecurity: Not on file  Transportation Needs: Not on file  Physical Activity: Not on file  Stress: Not on file  Social Connections: Not on file    Review of Systems Per HPI  Objective:  BP (!) 144/79   Pulse 61   Temp (!) 97 F (36.1 C)   Wt 139 lb 9.6 oz (63.3 kg)   SpO2 100%   BMI 27.26 kg/m      09/17/2022   10:48 AM 09/17/2022    9:57 AM 03/18/2022    9:57 AM  BP/Weight  Systolic BP 144 150 116  Diastolic BP 79 85 72  Wt. (Lbs)  139.6 140  BMI  27.26 kg/m2 27.34 kg/m2    Physical Exam Vitals and nursing note reviewed. Exam conducted with a chaperone present.  Constitutional:      General: She is not in acute distress.    Appearance: Normal appearance.  HENT:     Head: Normocephalic and atraumatic.  Cardiovascular:     Rate and Rhythm: Normal rate and regular rhythm.  Pulmonary:     Effort: Pulmonary effort is normal.     Breath sounds: Normal breath sounds. No wheezing, rhonchi or rales.  Genitourinary:    Comments: Pelvic Exam: External: normal female genitalia without lesions or masses Pelvic organ  prolapse noted (does not extend outside of the vagina on exam).  Neurological:     Mental Status: She is alert.  Psychiatric:        Mood and Affect: Mood normal.        Behavior: Behavior normal.     Lab Results  Component Value Date   WBC 6.8 03/20/2022   HGB 13.0 03/20/2022   HCT 39.1 03/20/2022   PLT 283 03/20/2022   GLUCOSE 109 (H) 03/20/2022   CHOL 134 03/20/2022   TRIG 133 03/20/2022   HDL 34 (L) 03/20/2022   LDLCALC 76 03/20/2022   ALT 24 03/20/2022   AST 20 03/20/2022   NA 142 03/20/2022   K 4.3 03/20/2022   CL 109 (H) 03/20/2022   CREATININE 0.75 03/20/2022   BUN 16 03/20/2022   CO2 21 03/20/2022   TSH 9.280 (H) 03/20/2022     Assessment & Plan:   Problem List Items Addressed This Visit       Cardiovascular and Mediastinum   Essential hypertension    BP mildly elevated today.  Continue amlodipine.         Endocrine   Hypothyroidism - Primary    TSH today.  Continue current dosing of Synthroid.  May need dose increase or decrease based on TSH levels.      Relevant Orders   TSH     Genitourinary   Female genital prolapse    Referring to GYN.      Relevant Orders   Ambulatory referral to Obstetrics / Gynecology    Follow-up:  6 months  Donivin Wirt Adriana Simas DO [facility]

## 2022-09-19 DIAGNOSIS — E039 Hypothyroidism, unspecified: Secondary | ICD-10-CM | POA: Diagnosis not present

## 2022-09-20 ENCOUNTER — Other Ambulatory Visit: Payer: Self-pay | Admitting: Family Medicine

## 2022-09-20 DIAGNOSIS — E039 Hypothyroidism, unspecified: Secondary | ICD-10-CM

## 2022-09-20 LAB — TSH: TSH: 1.89 u[IU]/mL (ref 0.450–4.500)

## 2022-09-27 ENCOUNTER — Ambulatory Visit
Admission: EM | Admit: 2022-09-27 | Discharge: 2022-09-27 | Disposition: A | Discharge status: Home / Self Care | Payer: Medicare Other

## 2022-09-27 DIAGNOSIS — R030 Elevated blood-pressure reading, without diagnosis of hypertension: Secondary | ICD-10-CM | POA: Diagnosis not present

## 2022-09-27 DIAGNOSIS — R519 Headache, unspecified: Secondary | ICD-10-CM

## 2022-09-27 NOTE — ED Provider Notes (Addendum)
[facility]    CSN: [id number] Arrival date & time: 09/27/22  1022      History   Chief Complaint No chief complaint on file.   HPI Jamie Torres is a 66 y.o. female.   Medical interpreter declined today, daughter who is with her today is her requested interpreter.  Patient presenting today with headache that started yesterday to bilateral temples that feels like a band squeezing her head.  She denies any associated symptoms to include visual changes, nausea, vomiting, mental status changes, dizziness, injury to the head, nasal congestion or sinus issues recently, neck soreness or tenderness.  She is concerned if her headache is related to her blood pressure as it has been slightly elevated recently.  She is taking her blood pressure medication consistently, on 2.5 mg of amlodipine and states she has been on this dose for about a year.  She does not ever check her blood pressures at home.    Past Medical History:  Diagnosis Date   Arthritis    Hypercholesteremia    Hypertension    Hypothyroidism    Plantar fasciitis    TB (pulmonary tuberculosis)    Thyroid disease     Patient Active Problem List   Diagnosis Date Noted   Female genital prolapse 09/18/2022   Hepatic steatosis 09/17/2021   Essential hypertension 11/13/2020   Mixed hyperlipidemia 05/25/2013   Hypothyroidism 09/02/2010    Past Surgical History:  Procedure Laterality Date   cancerous mole removed from eye     EYE SURGERY     TUBAL LIGATION      OB History     Gravida  4   Para  4   Term  4   Preterm      AB      Living  4      SAB      IAB      Ectopic      Multiple      Live Births               Home Medications    Prior to Admission medications   Medication Sig Start Date End Date Taking? Authorizing Provider  pantoprazole (PROTONIX) 40 MG tablet SMARTSIG:1 Tablet(s) By Mouth 07/19/22  Yes [provider]  amLODipine (NORVASC) 2.5 MG tablet TAKE  1 TABLET(2.5 MG) BY MOUTH DAILY 09/09/22   Everlene Other G, DO  diclofenac (VOLTAREN) 75 MG EC tablet TAKE 1 TABLET(75 MG) BY MOUTH TWICE DAILY WITH A MEAL 08/19/22   Cook, Jayce G, DO  fish oil-omega-3 fatty acids 1000 MG capsule Take 2 g by mouth daily.    [provider]  levothyroxine (SYNTHROID) 88 MCG tablet TAKE 1 TABLET(88 MCG) BY MOUTH DAILY 09/22/22   Everlene Other G, DO  rosuvastatin (CRESTOR) 10 MG tablet Take 1 tablet (10 mg total) by mouth daily. 07/25/22   Tommie Sams, DO    Family History History reviewed. No pertinent family history.  Social History Social History   Tobacco Use   Smoking status: Never   Smokeless tobacco: Never  Vaping Use   Vaping Use: Never used  Substance Use Topics   Alcohol use: No   Drug use: No     Allergies   Patient has no known allergies.   Review of Systems Review of Systems PER HPI  Physical Exam Triage Vital Signs ED Triage Vitals  Enc Vitals Group     BP 09/27/22 1030 (!) 153/90  Pulse Rate 09/27/22 1030 69     Resp 09/27/22 1030 16     Temp 09/27/22 1030 97.7 F (36.5 C)     Temp Source 09/27/22 1030 Oral     SpO2 09/27/22 1030 98 %     Weight --      Height --      Head Circumference --      Peak Flow --      Pain Score 09/27/22 1215 3     Pain Loc --      Pain Edu? --      Excl. in Winter? --    No data found.  Updated Vital Signs BP (!) 144/79 (BP Location: Right Arm)   Pulse 69   Temp 97.7 F (36.5 C) (Oral)   Resp 16   SpO2 98%   Visual Acuity Right Eye Distance:   Left Eye Distance:   Bilateral Distance:    Right Eye Near:   Left Eye Near:    Bilateral Near:     Physical Exam Vitals and nursing note reviewed.  Constitutional:      Appearance: Normal appearance.  HENT:     Head: Atraumatic.     Right Ear: Tympanic membrane and external ear normal.     Left Ear: Tympanic membrane and external ear normal.     Nose: Nose normal.     Mouth/Throat:     Mouth: Mucous membranes are  moist.     Pharynx: Oropharynx is clear.  Eyes:     Extraocular Movements: Extraocular movements intact.     Conjunctiva/sclera: Conjunctivae normal.  Cardiovascular:     Rate and Rhythm: Normal rate and regular rhythm.     Heart sounds: Normal heart sounds.  Pulmonary:     Effort: Pulmonary effort is normal.     Breath sounds: Normal breath sounds. No wheezing.  Musculoskeletal:        General: Normal range of motion.     Cervical back: Normal range of motion and neck supple.  Skin:    General: Skin is warm and dry.  Neurological:     Mental Status: She is alert and oriented to person, place, and time.     Cranial Nerves: No cranial nerve deficit.     Motor: No weakness.     Gait: Gait normal.  Psychiatric:        Mood and Affect: Mood normal.        Thought Content: Thought content normal.      UC Treatments / Results  Labs (all labs ordered are listed, but only abnormal results are displayed) Labs Reviewed - No data to display  EKG   Radiology No results found.  Procedures Procedures (including critical care time)  Medications Ordered in UC Medications - No data to display  Initial Impression / Assessment and Plan / UC Course  I have reviewed the triage vital signs and the nursing notes.  Pertinent labs & imaging results that were available during my care of the patient were reviewed by me and considered in my medical decision making (see chart for details).     Blood pressure mildly elevated today but otherwise vital signs benign and reassuring.  Her exam is very reassuring today with no focal neurologic deficits or red flag findings.  Possibly a tension headache, discussed heat and neck wraps, stress reduction, good hydration, ibuprofen and Tylenol as needed.  Continue amlodipine and follow-up with PCP for recheck next week.  Discussed at length red  flag findings to merit going to the emergency department for further evaluation  Final Clinical Impressions(s)  / UC Diagnoses   Final diagnoses:  Acute nonintractable headache, unspecified headache type  Elevated blood pressure reading     Discharge Instructions      If your headache worsens to the worst headache of your life or you experience visual changes, confusion, nausea, vomiting or any other worsening symptoms go to the emergency department for further evaluation.  Your exam today is very reassuring and your blood pressure readings are only slightly elevated.  Follow-up with your primary care provider next week for a recheck and further evaluation.    ED Prescriptions   None    PDMP not reviewed this encounter.   Volney American, PA-C 09/27/22 [place], [place], [place] 09/27/22 1240

## 2022-09-27 NOTE — ED Triage Notes (Signed)
Pt states she is having a headache that started yesterday, not having any SOB, chest pain or blurry vision. BP is 153/90 and is taking blood pressure medication as prescribed. Has not took any pain medication for the headache today.

## 2022-09-27 NOTE — ED Triage Notes (Signed)
Pt says she is still experiencing a headache but BP has decreased slightly.

## 2022-09-27 NOTE — Discharge Instructions (Signed)
If your headache worsens to the worst headache of your life or you experience visual changes, confusion, nausea, vomiting or any other worsening symptoms go to the emergency department for further evaluation.  Your exam today is very reassuring and your blood pressure readings are only slightly elevated.  Follow-up with your primary care provider next week for a recheck and further evaluation.

## 2022-09-28 ENCOUNTER — Ambulatory Visit: Payer: Self-pay

## 2022-09-30 ENCOUNTER — Encounter: Payer: Self-pay | Admitting: Family Medicine

## 2022-09-30 ENCOUNTER — Ambulatory Visit (INDEPENDENT_AMBULATORY_CARE_PROVIDER_SITE_OTHER): Payer: Medicare Other | Admitting: Family Medicine

## 2022-09-30 ENCOUNTER — Ambulatory Visit: Payer: Medicare Other | Admitting: Family Medicine

## 2022-09-30 VITALS — BP 118/76 | HR 80 | Temp 97.6°F | Wt 137.4 lb

## 2022-09-30 DIAGNOSIS — I10 Essential (primary) hypertension: Secondary | ICD-10-CM | POA: Diagnosis not present

## 2022-09-30 MED ORDER — AMLODIPINE BESYLATE 5 MG PO TABS: 5.0000 mg | ORAL_TABLET | Freq: Every day | ORAL | 3 refills | Status: DC

## 2022-09-30 NOTE — Progress Notes (Signed)
Subjective:  Patient ID: Jamie Torres, female    DOB: January 18, 1956  Age: 66 y.o. MRN: [id number]  CC: Chief Complaint  Patient presents with   Follow-up    From urgent care for high blood pressure. Patient has question about dosage of BP medication. Patient has been taking amlodipine 2.5 mg x 2 tablets daily. Patient states this seems to be helping with her BP.    HPI:  66 year old female presents for evaluation of the above.  She is accompanied by a translator today.  Patient reports that she has recently had elevated blood pressure.  She has been on amlodipine 2.5 mg daily.  She was seen at a local urgent care.  She has increased amlodipine to 5 mg daily.  She is doing much better.  Blood pressure is well-controlled today.  Patient states that she has had some warmth around her face, ears and behind her neck.  No reports of chest pain or shortness of breath.  No other complaints or concerns at this time.  Patient Active Problem List   Diagnosis Date Noted   Female genital prolapse 09/18/2022   Hepatic steatosis 09/17/2021   Essential hypertension 11/13/2020   Mixed hyperlipidemia 05/25/2013   Hypothyroidism 09/02/2010    Social Hx   Social History   Socioeconomic History   Marital status: Married    Spouse name: Not on file   Number of children: Not on file   Years of education: Not on file   Highest education level: Not on file  Occupational History   Not on file  Tobacco Use   Smoking status: Never   Smokeless tobacco: Never  Vaping Use   Vaping Use: Never used  Substance and Sexual Activity   Alcohol use: No   Drug use: No   Sexual activity: Not on file  Other Topics Concern   Not on file  Social History Narrative   Not on file   Social Determinants of Health   Financial Resource Strain: Not on file  Food Insecurity: Not on file  Transportation Needs: Not on file  Physical Activity: Not on file  Stress: Not on file  Social Connections: Not on file     Review of Systems Per HPI  Objective:  BP 118/76   Pulse 80   Temp 97.6 F (36.4 C) (Oral)   Wt 137 lb 6.4 oz (62.3 kg)   SpO2 97%   BMI 26.83 kg/m      09/30/2022    3:06 PM 09/27/2022   12:14 PM 09/27/2022   10:30 AM  BP/Weight  Systolic BP 118 144 153  Diastolic BP 76 79 90  Wt. (Lbs) 137.4    BMI 26.83 kg/m2      Physical Exam Vitals and nursing note reviewed.  Constitutional:      General: She is not in acute distress.    Appearance: Normal appearance.  HENT:     Head: Normocephalic and atraumatic.  Cardiovascular:     Rate and Rhythm: Normal rate and regular rhythm.  Pulmonary:     Effort: Pulmonary effort is normal.     Breath sounds: Normal breath sounds. No wheezing, rhonchi or rales.  Neurological:     Mental Status: She is alert.  Psychiatric:        Mood and Affect: Mood normal.        Behavior: Behavior normal.     Lab Results  Component Value Date   WBC 6.8 03/20/2022   HGB 13.0 03/20/2022  HCT 39.1 03/20/2022   PLT 283 03/20/2022   GLUCOSE 109 (H) 03/20/2022   CHOL 134 03/20/2022   TRIG 133 03/20/2022   HDL 34 (L) 03/20/2022   LDLCALC 76 03/20/2022   ALT 24 03/20/2022   AST 20 03/20/2022   NA 142 03/20/2022   K 4.3 03/20/2022   CL 109 (H) 03/20/2022   CREATININE 0.75 03/20/2022   BUN 16 03/20/2022   CO2 21 03/20/2022   TSH 1.890 09/19/2022     Assessment & Plan:   Problem List Items Addressed This Visit       Cardiovascular and Mediastinum   Essential hypertension - Primary    Recent elevated blood pressure.  Amlodipine has been increased.  Therefore I increased her dosing to 5 mg daily.      Relevant Medications   amLODipine (NORVASC) 5 MG tablet    Meds ordered this encounter  Medications   amLODipine (NORVASC) 5 MG tablet    Sig: Take 1 tablet (5 mg total) by mouth daily.    Dispense:  90 tablet    Refill:  3    Follow-up:  Return in about 6 months (around 04/01/2023).  Everlene Other DO [facility]

## 2022-09-30 NOTE — Assessment & Plan Note (Signed)
Recent elevated blood pressure.  Amlodipine has been increased.  Therefore I increased her dosing to 5 mg daily.

## 2022-09-30 NOTE — Patient Instructions (Signed)
Pressure looks good.  Normal Exam.  Follow up in 6 months.

## 2022-10-19 ENCOUNTER — Emergency Department (HOSPITAL_COMMUNITY): Payer: Medicare Other

## 2022-10-19 ENCOUNTER — Other Ambulatory Visit: Payer: Self-pay

## 2022-10-19 ENCOUNTER — Emergency Department (HOSPITAL_COMMUNITY)
Admission: EM | Admit: 2022-10-19 | Discharge: 2022-10-19 | Disposition: A | Discharge status: Home / Self Care | Payer: Medicare Other | Attending: Emergency Medicine | Admitting: Emergency Medicine

## 2022-10-19 ENCOUNTER — Encounter (HOSPITAL_COMMUNITY): Payer: Self-pay | Admitting: *Deleted

## 2022-10-19 DIAGNOSIS — R519 Headache, unspecified: Secondary | ICD-10-CM | POA: Diagnosis not present

## 2022-10-19 DIAGNOSIS — R200 Anesthesia of skin: Secondary | ICD-10-CM | POA: Diagnosis not present

## 2022-10-19 DIAGNOSIS — Z79899 Other long term (current) drug therapy: Secondary | ICD-10-CM | POA: Diagnosis not present

## 2022-10-19 DIAGNOSIS — J811 Chronic pulmonary edema: Secondary | ICD-10-CM | POA: Diagnosis not present

## 2022-10-19 DIAGNOSIS — R0789 Other chest pain: Secondary | ICD-10-CM | POA: Insufficient documentation

## 2022-10-19 DIAGNOSIS — R079 Chest pain, unspecified: Secondary | ICD-10-CM

## 2022-10-19 DIAGNOSIS — J9811 Atelectasis: Secondary | ICD-10-CM | POA: Diagnosis not present

## 2022-10-19 LAB — CBC
HCT: 40.8 % (ref 36.0–46.0)
Hemoglobin: 12.9 g/dL (ref 12.0–15.0)
MCH: 27.7 pg (ref 26.0–34.0)
MCHC: 31.6 g/dL (ref 30.0–36.0)
MCV: 87.7 fL (ref 80.0–100.0)
Platelets: 310 10*3/uL (ref 150–400)
RBC: 4.65 MIL/uL (ref 3.87–5.11)
RDW: 14.4 % (ref 11.5–15.5)
WBC: 8.2 10*3/uL (ref 4.0–10.5)
nRBC: 0 % (ref 0.0–0.2)

## 2022-10-19 LAB — BASIC METABOLIC PANEL
Anion gap: 7 (ref 5–15)
BUN: 21 mg/dL (ref 8–23)
CO2: 21 mmol/L — ABNORMAL LOW (ref 22–32)
Calcium: 8.8 mg/dL — ABNORMAL LOW (ref 8.9–10.3)
Chloride: 104 mmol/L (ref 98–111)
Creatinine, Ser: 0.68 mg/dL (ref 0.44–1.00)
GFR, Estimated: >60 mL/min (ref >60)
Glucose, Bld: 145 mg/dL — ABNORMAL HIGH (ref 70–99)
Potassium: 3.9 mmol/L (ref 3.5–5.1)
Sodium: 132 mmol/L — ABNORMAL LOW (ref 135–145)

## 2022-10-19 LAB — TROPONIN I (HIGH SENSITIVITY)
Troponin I (High Sensitivity): <2 ng/L (ref <18)
Troponin I (High Sensitivity): <2 ng/L (ref <18)

## 2022-10-19 NOTE — Discharge Instructions (Signed)
You have been recommended to follow-up with a cardiologist.  Someone from the cardiologist office will be contacting you tomorrow to arrange a follow-up appointment.  You may also follow-up with your primary care provider for recheck.  Return to the emergency department for any new or worsening symptoms

## 2022-10-19 NOTE — ED Triage Notes (Signed)
Pt with left sided CP since yesterday, today began to have left arm numbness, unable to state exact time. Pt does not speak ENglish and son with pt and does speak [place].  Denies any SOB or N/V. C/O  fatigue

## 2022-10-19 NOTE — ED Provider Notes (Signed)
[facility] EMERGENCY DEPARTMENT Provider Note   CSN: [id number] Arrival date & time: 10/19/22  1642     History {Add pertinent medical, surgical, social history, OB history to HPI:1} Chief Complaint  Patient presents with   Chest Pain    Jamie Torres is a 67 y.o. female.  The history is provided by the patient and a relative. A language interpreter was used.  Chest Pain Associated symptoms: headache and numbness (localized numbness of mid left arm)   Associated symptoms: no abdominal pain, no back pain, no cough, no dizziness, no fever, no nausea, no shortness of breath, no vomiting and no weakness        Jamie Torres is a 67 y.o. female who presents to the Emergency Department complaining of left sided chest pain this morning.  She had some localized numbness of her left arm as well. Pain lasted 3-4 hours before spontaneously resolving.  She denies any pain upon arrival.  Numbness of the arm localized to the elbow area only,  she denies neck or jaw pain or pain radiating down her arm.  She denies recent illness, coughing, known injury, fever, chills shortness of breath.  Endorses mild frontal headache.    Home Medications Prior to Admission medications   Medication Sig Start Date End Date Taking? Authorizing Provider  amLODipine (NORVASC) 5 MG tablet Take 1 tablet (5 mg total) by mouth daily. 09/30/22   Coral Spikes, DO  diclofenac (VOLTAREN) 75 MG EC tablet TAKE 1 TABLET(75 MG) BY MOUTH TWICE DAILY WITH A MEAL 08/19/22   Cook, [place] G, DO  fish oil-omega-3 fatty acids 1000 MG capsule Take 2 g by mouth daily.    [provider]  levothyroxine (SYNTHROID) 88 MCG tablet TAKE 1 TABLET(88 MCG) BY MOUTH DAILY 09/22/22   Thersa Salt G, DO  pantoprazole (PROTONIX) 40 MG tablet SMARTSIG:1 Tablet(s) By Mouth 07/19/22   [provider]  rosuvastatin (CRESTOR) 10 MG tablet Take 1 tablet (10 mg total) by mouth daily. 07/25/22   Coral Spikes, DO      Allergies     Patient has no known allergies.    Review of Systems   Review of Systems  Constitutional:  Negative for chills and fever.  Respiratory:  Negative for cough, chest tightness and shortness of breath.   Cardiovascular:  Positive for chest pain.  Gastrointestinal:  Negative for abdominal pain, nausea and vomiting.  Musculoskeletal:  Positive for myalgias. Negative for arthralgias and back pain.  Neurological:  Positive for numbness (localized numbness of mid left arm) and headaches. Negative for dizziness, syncope, facial asymmetry, speech difficulty and weakness.  Psychiatric/Behavioral:  Negative for confusion.     Physical Exam Updated Vital Signs BP 121/69   Pulse 72   Temp 98.5 F (36.9 C) (Oral)   Resp 15   Wt 61.7 kg   SpO2 95%   BMI 26.56 kg/m  Physical Exam Vitals and nursing note reviewed.  Constitutional:      General: She is not in acute distress.    Appearance: She is not ill-appearing or toxic-appearing.  HENT:     Head: Atraumatic.     Mouth/Throat:     Mouth: Mucous membranes are moist.  Eyes:     Extraocular Movements: Extraocular movements intact.     Conjunctiva/sclera: Conjunctivae normal.     Pupils: Pupils are equal, round, and reactive to light.  Cardiovascular:     Rate and Rhythm: Normal rate and regular rhythm.  Pulses: Normal pulses.  Pulmonary:     Effort: Pulmonary effort is normal. No respiratory distress.  Abdominal:     Palpations: Abdomen is soft.     Tenderness: There is no abdominal tenderness.  Musculoskeletal:        General: Normal range of motion.     Cervical back: No tenderness.     Right lower leg: No edema.     Left lower leg: No edema.  Lymphadenopathy:     Cervical: No cervical adenopathy.  Skin:    General: Skin is warm.     Capillary Refill: Capillary refill takes less than 2 seconds.     Findings: No rash.  Neurological:     General: No focal deficit present.     Mental Status: She is alert.     Sensory:  Sensation is intact. No sensory deficit.     Motor: Motor function is intact. No weakness.     Coordination: Coordination is intact.     Comments: CN II through XII intact.  Speech clear.  No pronator drift.     ED Results / Procedures / Treatments   Labs (all labs ordered are listed, but only abnormal results are displayed) Labs Reviewed  BASIC METABOLIC PANEL - Abnormal; Notable for the following components:      Result Value   Sodium 132 (*)    CO2 21 (*)    Glucose, Bld 145 (*)    Calcium 8.8 (*)    All other components within normal limits  CBC  TROPONIN I (HIGH SENSITIVITY)  TROPONIN I (HIGH SENSITIVITY)    EKG EKG Interpretation  Date/Time:  Sunday October 19 2022 16:54:03 EST Ventricular Rate:  87 PR Interval:  150 QRS Duration: 86 QT Interval:  376 QTC Calculation: 452 R Axis:   30 Text Interpretation: Normal sinus rhythm Normal ECG No previous ECGs available Confirmed by Bethann Berkshire [phone]) on 10/19/2022 5:25:06 PM  Radiology DG Chest Port 1 View  Result Date: 10/19/2022 CLINICAL DATA:  Left-sided chest pain. EXAM: PORTABLE CHEST 1 VIEW COMPARISON:  Chest radiographs 07/27/2018 FINDINGS: The patient is rotated to the right. The cardiac silhouette is borderline enlarged. Lung volumes are low with mild central pulmonary vascular congestion and bibasilar atelectasis. No sizable pleural effusion or pneumothorax is identified. No acute osseous abnormality is seen. IMPRESSION: Low lung volumes with mild pulmonary vascular congestion and bibasilar atelectasis. Electronically Signed   By: Sebastian Ache M.D.   On: 10/19/2022 17:47    Procedures Procedures  {Document cardiac monitor, telemetry assessment procedure when appropriate:1}  Medications Ordered in ED Medications - No data to display  ED Course/ Medical Decision Making/ A&P                           Medical Decision Making Interpreter line used.  Patient here reporting chest pain lasting several hours  earlier today.  Pain spontaneously resolved.  Pain was associated with a localized area of numbness to her mid left arm.  No chest pain upon arrival.  Pain was not associated with shortness of breath neck or jaw pain.  She endorses mild frontal headache but denies extremity or facial weakness.  On exam, patient well-appearing.  Vital signs reassuring.  She has a reassuring neurovascular exam.  Gross sensation of the extremities is intact.  Do not appreciate any unilateral or facial weakness.  No speech changes.  Patient with left-sided chest pain this morning that spontaneously resolved, because  of her limited arm numbness is unclear at this time.  I do not appreciate any neurovascular deficits.  Will check labs, EKG chest x-ray.  Amount and/or Complexity of Data Reviewed Labs: ordered.    Details: Labs interpreted by me, no evidence of leukocytosis, no anemia.  Chemistries without significant derangement.  Initial troponin reassuring Radiology: ordered.    Details: Chest x-ray shows mild pulmonary vascular congestion and atelectasis ECG/medicine tests: ordered.    Details: EKG shows normal sinus rhythm     {Document critical care time when appropriate:1} {Document review of labs and clinical decision tools ie heart score, Chads2Vasc2 etc:1}  {Document your independent review of radiology images, and any outside records:1} {Document your discussion with family members, caretakers, and with consultants:1} {Document social determinants of health affecting pt's care:1} {Document your decision making why or why not admission, treatments were needed:1} Final Clinical Impression(s) / ED Diagnoses Final diagnoses:  None    Rx / DC Orders ED Discharge Orders     None

## 2022-10-23 ENCOUNTER — Telehealth: Payer: Self-pay

## 2022-10-23 NOTE — Telephone Encounter (Signed)
     Patient  visit on 1/7  at [place] you been able to follow up with your primary care physician? No   The patient was or was not able to obtain any needed medicine or equipment. Yes   Are there diet recommendations that you are having difficulty following? Na   Patient expresses understanding of discharge instructions and education provided has no other needs at this time.  Yes      [place], [facility], Care Management  [phone] [address], [place], [place] [address] Phone: [phone] Email: [email or web address]

## 2022-11-14 ENCOUNTER — Other Ambulatory Visit: Payer: Self-pay | Admitting: Family Medicine

## 2022-11-14 DIAGNOSIS — M25541 Pain in joints of right hand: Secondary | ICD-10-CM

## 2022-11-18 ENCOUNTER — Other Ambulatory Visit: Payer: Self-pay | Admitting: Family Medicine

## 2022-11-28 ENCOUNTER — Ambulatory Visit: Payer: Medicare Other | Attending: Cardiology | Admitting: Cardiology

## 2022-11-28 ENCOUNTER — Encounter: Payer: Self-pay | Admitting: Cardiology

## 2022-11-28 VITALS — BP 102/58 | HR 62 | Ht 60.0 in | Wt 136.0 lb

## 2022-11-28 DIAGNOSIS — R0789 Other chest pain: Secondary | ICD-10-CM

## 2022-11-28 NOTE — Patient Instructions (Addendum)
Medication Instructions:  Your physician recommends that you continue on your current medications as directed. Please refer to the Current Medication list given to you today.   Labwork: None  Testing/Procedures: None  Follow-Up: Follow up as needed with Dr. Harl Bowie   Any Other Special Instructions Will Be Listed Below (If Applicable).     If you need a refill on your cardiac medications before your next appointment, please call your pharmacy.

## 2022-11-28 NOTE — Progress Notes (Signed)
Clinical Summary Ms. Vanpelt is a 67 y.o.female seen today as a new consult, referred for the following medical problems.  1.Chest pain - ER visit 10/19/22 with chest pain - from notes lasted several hours, resolved. - trops neg x 2. EKG SR, no ischemic changes  CXR Low lung volumes with mild pulmonary vascular congestion and bibasilar atelectasis.  - pain started 2 day prior to ER visit. Left sided pressure,pulling in chest. 2/10 in severity. Not sure if was with activity or at rest. No other associated symptoms. Lasted about 1 hour. Worst with position changes.  - no recurrent symptoms - no exertional symptoms at home  CAD risk factors: HTN, hyperliidemia, age  Past Medical History:  Diagnosis Date   Arthritis    Hypercholesteremia    Hypertension    Hypothyroidism    Plantar fasciitis    TB (pulmonary tuberculosis)    Thyroid disease      No Known Allergies   Current Outpatient Medications  Medication Sig Dispense Refill   amLODipine (NORVASC) 5 MG tablet Take 1 tablet (5 mg total) by mouth daily. 90 tablet 3   diclofenac (VOLTAREN) 75 MG EC tablet TAKE 1 TABLET(75 MG) BY MOUTH TWICE DAILY WITH A MEAL 180 tablet 0   fish oil-omega-3 fatty acids 1000 MG capsule Take 2 g by mouth daily.     levothyroxine (SYNTHROID) 88 MCG tablet TAKE 1 TABLET(88 MCG) BY MOUTH DAILY 90 tablet 1   pantoprazole (PROTONIX) 40 MG tablet SMARTSIG:1 Tablet(s) By Mouth     rosuvastatin (CRESTOR) 10 MG tablet TAKE 1 TABLET BY MOUTH DAILY 30 tablet 3   No current facility-administered medications for this visit.     Past Surgical History:  Procedure Laterality Date   cancerous mole removed from eye     EYE SURGERY     TUBAL LIGATION       No Known Allergies    No family history on file.   Social History Ms. Haussler reports that she has never smoked. She has never used smokeless tobacco. Ms. Leimer reports no history of alcohol use.   Review of Systems CONSTITUTIONAL:  No weight loss, fever, chills, weakness or fatigue.  HEENT: Eyes: No visual loss, blurred vision, double vision or yellow sclerae.No hearing loss, sneezing, congestion, runny nose or sore throat.  SKIN: No rash or itching.  CARDIOVASCULAR: per hpi RESPIRATORY: No shortness of breath, cough or sputum.  GASTROINTESTINAL: No anorexia, nausea, vomiting or diarrhea. No abdominal pain or blood.  GENITOURINARY: No burning on urination, no polyuria NEUROLOGICAL: No headache, dizziness, syncope, paralysis, ataxia, numbness or tingling in the extremities. No change in bowel or bladder control.  MUSCULOSKELETAL: No muscle, back pain, joint pain or stiffness.  LYMPHATICS: No enlarged nodes. No history of splenectomy.  PSYCHIATRIC: No history of depression or anxiety.  ENDOCRINOLOGIC: No reports of sweating, cold or heat intolerance. No polyuria or polydipsia.  Marland Kitchen   Physical Examination Today's Vitals   11/28/22 1500  BP: (!) 102/58  Pulse: 62  SpO2: 98%  Weight: 136 lb (61.7 kg)  Height: 5' (1.524 m)   Body mass index is 26.56 kg/m.  Gen: resting comfortably, no acute distress HEENT: no scleral icterus, pupils equal round and reactive, no palptable cervical adenopathy,  CV: RRR, no m/r/g no jvd Resp: Clear to auscultation bilaterally GI: abdomen is soft, non-tender, non-distended, normal bowel sounds, no hepatosplenomegaly MSK: extremities are warm, no edema.  Skin: warm, no rash Neuro:  no focal deficits  Psych: appropriate affect    Assessment and Plan  1.Chest pain - atypical chest pain in early January with negative ER workup - no significnat symptoms since - no plans for ischemic testing at this time - can f/u just as needed      Arnoldo Lenis, M.D.

## 2023-02-04 ENCOUNTER — Ambulatory Visit (INDEPENDENT_AMBULATORY_CARE_PROVIDER_SITE_OTHER): Payer: Medicare Other | Admitting: Family Medicine

## 2023-02-04 ENCOUNTER — Encounter: Payer: Self-pay | Admitting: Family Medicine

## 2023-02-04 VITALS — BP 118/70 | HR 77 | Temp 97.7°F | Ht 60.0 in | Wt 135.0 lb

## 2023-02-04 DIAGNOSIS — E039 Hypothyroidism, unspecified: Secondary | ICD-10-CM | POA: Diagnosis not present

## 2023-02-04 DIAGNOSIS — M25552 Pain in left hip: Secondary | ICD-10-CM | POA: Diagnosis not present

## 2023-02-04 DIAGNOSIS — M25561 Pain in right knee: Secondary | ICD-10-CM | POA: Diagnosis not present

## 2023-02-04 DIAGNOSIS — M25562 Pain in left knee: Secondary | ICD-10-CM | POA: Diagnosis not present

## 2023-02-04 MED ORDER — LEVOTHYROXINE SODIUM 88 MCG PO TABS: ORAL_TABLET | ORAL | 1 refills | Status: DC

## 2023-02-04 MED ORDER — PANTOPRAZOLE SODIUM 40 MG PO TBEC: 40.0000 mg | DELAYED_RELEASE_TABLET | Freq: Every day | ORAL | 1 refills | Status: DC

## 2023-02-04 MED ORDER — AMLODIPINE BESYLATE 5 MG PO TABS: 5.0000 mg | ORAL_TABLET | Freq: Every day | ORAL | 3 refills | Status: DC

## 2023-02-04 MED ORDER — ETODOLAC 400 MG PO TABS: 400.0000 mg | ORAL_TABLET | Freq: Two times a day (BID) | ORAL | 3 refills | Status: DC

## 2023-02-04 MED ORDER — ROSUVASTATIN CALCIUM 10 MG PO TABS: 10.0000 mg | ORAL_TABLET | Freq: Every day | ORAL | 3 refills | Status: DC

## 2023-02-04 NOTE — Progress Notes (Signed)
Subjective:  Patient ID: Jamie Torres, female    DOB: [birth year]  Age: 67 y.o. MRN: [id number]  CC: Chief Complaint  Patient presents with   left leg pain     Since march , bilateral foot swelling was recently in [place] 2 weeks ago    HPI:  67 year old female who presents for evaluation of the above.  Spanish interpreter present today.  Patient reports that she has had ongoing left leg pain since the beginning of March.  No known inciting factor.  She states that she is having left thigh pain as well as left knee pain.  Additionally, patient states that she is having right knee pain.  No medications or interventions tried.  No other complaints at this time.  Patient Active Problem List   Diagnosis Date Noted   Acute pain of both knees 02/04/2023   Left hip pain 02/04/2023   Female genital prolapse 09/18/2022   Hepatic steatosis 09/17/2021   Essential hypertension 11/13/2020   Mixed hyperlipidemia 05/25/2013   Hypothyroidism 09/02/2010    Social Hx   Social History   Socioeconomic History   Marital status: Married    Spouse name: Not on file   Number of children: Not on file   Years of education: Not on file   Highest education level: Not on file  Occupational History   Not on file  Tobacco Use   Smoking status: Never   Smokeless tobacco: Never  Vaping Use   Vaping Use: Never used  Substance and Sexual Activity   Alcohol use: No   Drug use: No   Sexual activity: Not on file  Other Topics Concern   Not on file  Social History Narrative   Not on file   Social Determinants of Health   Financial Resource Strain: Not on file  Food Insecurity: Not on file  Transportation Needs: Not on file  Physical Activity: Not on file  Stress: Not on file  Social Connections: Not on file    Review of Systems Per HPI  Objective:  BP 118/70   Pulse 77   Temp 97.7 F (36.5 C)   Ht 5' (1.524 m)   Wt 135 lb (61.2 kg)   SpO2 100%   BMI 26.37 kg/m      02/04/2023    10:57 AM 11/28/2022    3:00 PM 10/19/2022    8:30 PM  BP/Weight  Systolic BP 118 102 131  Diastolic BP 70 58 73  Wt. (Lbs) 135 136   BMI 26.37 kg/m2 26.56 kg/m2     Physical Exam Vitals and nursing note reviewed.  Constitutional:      General: She is not in acute distress.    Appearance: Normal appearance.  HENT:     Head: Normocephalic and atraumatic.  Cardiovascular:     Rate and Rhythm: Normal rate and regular rhythm.  Pulmonary:     Effort: Pulmonary effort is normal.     Breath sounds: Normal breath sounds.  Musculoskeletal:     Comments: No joint line tenderness to palpation of the right knee or the left knee.  No appreciable effusion.  Neurological:     Mental Status: She is alert.  Psychiatric:        Mood and Affect: Mood normal.        Behavior: Behavior normal.     Lab Results  Component Value Date   WBC 8.2 10/19/2022   HGB 12.9 10/19/2022   HCT 40.8 10/19/2022   PLT  310 10/19/2022   GLUCOSE 145 (H) 10/19/2022   CHOL 134 03/20/2022   TRIG 133 03/20/2022   HDL 34 (L) 03/20/2022   LDLCALC 76 03/20/2022   ALT 24 03/20/2022   AST 20 03/20/2022   NA 132 (L) 10/19/2022   K 3.9 10/19/2022   CL 104 10/19/2022   CREATININE 0.68 10/19/2022   BUN 21 10/19/2022   CO2 21 (L) 10/19/2022   TSH 1.890 09/19/2022     Assessment & Plan:   Problem List Items Addressed This Visit       Endocrine   Hypothyroidism   Relevant Medications   levothyroxine (SYNTHROID) 88 MCG tablet     Other   Left hip pain    X-ray for further evaluation.  Starting on Etodolac.       Relevant Orders   DG Hip Unilat W OR W/O Pelvis 2-3 Views Left   Acute pain of both knees - Primary    X-rays for further evaluation.  Starting on etodolac.      Relevant Orders   DG Knee Complete 4 Views Right   DG Knee Complete 4 Views Left    Meds ordered this encounter  Medications   amLODipine (NORVASC) 5 MG tablet    Sig: Take 1 tablet (5 mg total) by mouth daily.    Dispense:   90 tablet    Refill:  3   levothyroxine (SYNTHROID) 88 MCG tablet    Sig: TAKE 1 TABLET(88 MCG) BY MOUTH DAILY    Dispense:  90 tablet    Refill:  1   rosuvastatin (CRESTOR) 10 MG tablet    Sig: Take 1 tablet (10 mg total) by mouth daily.    Dispense:  30 tablet    Refill:  3   pantoprazole (PROTONIX) 40 MG tablet    Sig: Take 1 tablet (40 mg total) by mouth daily.    Dispense:  90 tablet    Refill:  1   etodolac (LODINE) 400 MG tablet    Sig: Take 1 tablet (400 mg total) by mouth 2 (two) times daily.    Dispense:  60 tablet    Refill:  3    Follow-up:  Pending Xray results.   Everlene Other DO [facility]

## 2023-02-04 NOTE — Patient Instructions (Signed)
Xrays at the hospital.  Medication as directed.  We will call with results.  Take care  Dr. Adriana Simas

## 2023-02-04 NOTE — Assessment & Plan Note (Signed)
X-rays for further evaluation.  Starting on etodolac.

## 2023-02-04 NOTE — Assessment & Plan Note (Signed)
X-ray for further evaluation.  Starting on Etodolac.

## 2023-02-05 ENCOUNTER — Ambulatory Visit (HOSPITAL_COMMUNITY)
Admission: RE | Admit: 2023-02-05 | Discharge: 2023-02-05 | Disposition: A | Discharge status: Home / Self Care | Payer: Medicare Other | Source: Ambulatory Visit | Attending: Family Medicine | Admitting: Family Medicine

## 2023-02-05 DIAGNOSIS — M25561 Pain in right knee: Secondary | ICD-10-CM | POA: Diagnosis not present

## 2023-02-05 DIAGNOSIS — M25552 Pain in left hip: Secondary | ICD-10-CM | POA: Insufficient documentation

## 2023-02-05 DIAGNOSIS — M25562 Pain in left knee: Secondary | ICD-10-CM | POA: Diagnosis not present

## 2023-02-13 ENCOUNTER — Other Ambulatory Visit: Payer: Self-pay | Admitting: Family Medicine

## 2023-02-13 DIAGNOSIS — M25541 Pain in joints of right hand: Secondary | ICD-10-CM

## 2023-02-22 ENCOUNTER — Ambulatory Visit
Admission: EM | Admit: 2023-02-22 | Discharge: 2023-02-22 | Disposition: A | Discharge status: Home / Self Care | Payer: Medicare Other | Attending: Nurse Practitioner | Admitting: Nurse Practitioner

## 2023-02-22 DIAGNOSIS — N3000 Acute cystitis without hematuria: Secondary | ICD-10-CM

## 2023-02-22 LAB — POCT URINALYSIS DIP (MANUAL ENTRY)
Blood, UA: NEGATIVE
Glucose, UA: 100 mg/dL — AB
Nitrite, UA: POSITIVE — AB
Spec Grav, UA: 1.02 (ref 1.010–1.025)
Urobilinogen, UA: 1 E.U./dL
pH, UA: 5 (ref 5.0–8.0)

## 2023-02-22 MED ORDER — CEPHALEXIN 500 MG PO CAPS: 500.0000 mg | ORAL_CAPSULE | Freq: Two times a day (BID) | ORAL | 0 refills | Status: AC

## 2023-02-22 NOTE — ED Provider Notes (Signed)
[facility]    CSN: [id number] Arrival date & time: 02/22/23  4098      History   Chief Complaint Chief Complaint  Patient presents with   Urinary Tract Infection    HPI Jamie Torres is a 67 y.o. female.   The history is provided by the patient and a relative (daughter).   The patient presents with her daughter for complaints of urinary frequency, urgency, lower abdominal pain, and chills.  Symptoms have been present over the past several days.  Patient denies pain with urination, hematuria, decreased urine stream, low back pain, flank pain, vaginal symptoms, or foul-smelling urine.  Patient's daughter states patient's last UTI was approximately 2 years ago.  Patient has been taking Azo with some relief.  Patient and daughter deny history of kidney infection, and kidney stones.  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 Authorizing Provider  amLODipine (NORVASC) 5 MG tablet Take 1 tablet (5 mg total) by mouth daily. 02/04/23  Yes Cook, Jayce G, DO  cephALEXin (KEFLEX) 500 MG capsule Take 1 capsule (500 mg total) by mouth 2 (two)  times daily for 7 days. 02/22/23 03/01/23 Yes Maclean Foister-Warren, Sadie Haber, NP  etodolac (LODINE) 400 MG tablet Take 1 tablet (400 mg total) by mouth 2 (two) times daily. 02/04/23  Yes Cook, Jayce G, DO  fish oil-omega-3 fatty acids 1000 MG capsule Take 2 g by mouth daily.   Yes [provider]  levothyroxine (SYNTHROID) 88 MCG tablet TAKE 1 TABLET(88 MCG) BY MOUTH DAILY 02/04/23  Yes Cook, Jayce G, DO  pantoprazole (PROTONIX) 40 MG tablet Take 1 tablet (40 mg total) by mouth daily. 02/04/23  Yes Cook, Jayce G, DO  rosuvastatin (CRESTOR) 10 MG tablet Take 1 tablet (10 mg total) by mouth daily. 02/04/23  Yes Tommie Sams, DO    Family History History reviewed. No pertinent family history.  Social History Social History   Tobacco Use   Smoking status: Never   Smokeless tobacco: Never  Vaping Use   Vaping Use: Never used  Substance Use Topics   Alcohol use: No   Drug use: No     Allergies   Patient has no known allergies.   Review of Systems Review of  Systems Per HPI  Physical Exam Triage Vital Signs ED Triage Vitals  Enc Vitals Group     BP 02/22/23 0925 114/72     Pulse Rate 02/22/23 0925 61     Resp 02/22/23 0925 16     Temp 02/22/23 0925 98 F (36.7 C)     Temp Source 02/22/23 0925 Oral     SpO2 02/22/23 0925 98 %     Weight 02/22/23 0924 136 lb (61.7 kg)     Height --      Head Circumference --      Peak Flow --      Pain Score 02/22/23 0923 2     Pain Loc --      Pain Edu? --      Excl. in GC? --    No data found.  Updated Vital Signs BP 114/72 (BP Location: Right Arm)   Pulse 61   Temp 98 F (36.7 C) (Oral)   Resp 16   Wt 136 lb (61.7 kg)   SpO2 98%   BMI 26.56 kg/m   Visual Acuity Right Eye Distance:   Left Eye Distance:   Bilateral Distance:    Right Eye Near:   Left Eye Near:    Bilateral Near:     Physical Exam Vitals and nursing note reviewed.  Constitutional:      General: She is not in acute distress.    Appearance: Normal  appearance.  HENT:     Head: Normocephalic.  Eyes:     Extraocular Movements: Extraocular movements intact.     Pupils: Pupils are equal, round, and reactive to light.  Cardiovascular:     Rate and Rhythm: Normal rate and regular rhythm.     Pulses: Normal pulses.     Heart sounds: Normal heart sounds.  Pulmonary:     Effort: Pulmonary effort is normal. No respiratory distress.     Breath sounds: Normal breath sounds. No stridor. No wheezing, rhonchi or rales.  Abdominal:     General: Abdomen is flat. Bowel sounds are normal.     Palpations: Abdomen is soft.     Tenderness: There is no abdominal tenderness.  Musculoskeletal:     Cervical back: Normal range of motion.  Lymphadenopathy:     Cervical: No cervical adenopathy.  Skin:    General: Skin is warm and dry.  Neurological:     General: No focal deficit present.     Mental Status: She is alert and oriented to person, place, and time.  Psychiatric:        Mood and Affect: Mood normal.        Behavior: Behavior normal.      UC Treatments / Results  Labs (all labs ordered are listed, but only abnormal results are displayed) Labs Reviewed  POCT URINALYSIS DIP (MANUAL ENTRY) - Abnormal; Notable for the following components:      Result Value   Color, UA red (*)    Clarity, UA cloudy (*)    Glucose, UA =100 (*)    Bilirubin, UA small (*)    Ketones, POC UA trace (5) (*)    Protein Ur, POC trace (*)    Nitrite, UA Positive (*)    Leukocytes, UA Trace (*)    All other components within normal limits  URINE CULTURE    EKG   Radiology No results found.  Procedures Procedures (including critical care time)  Medications Ordered in UC Medications - No data to display  Initial Impression / Assessment and Plan / UC Course  I have reviewed the triage vital signs and the nursing notes.  Pertinent labs & imaging results that were available during my care of the patient were reviewed by me and considered in my  medical decision making (see chart for details).  The patient is well-appearing, she is in no acute distress, vital signs are stable.  Urinalysis is positive for urinary tract infection.  Will start patient on Keflex 500 mg twice daily for the next 7 days.  Urine culture is pending.  Supportive care recommendations were provided and discussed with the patient and her daughter to include increasing her fluid intake, continuing over-the-counter Azo, and use of over-the-counter analgesics for pain or discomfort.  Patient was provided indications of when follow-up in the emergency department would be indicated.  Patient's daughter and patient are in agreement with this plan of care and verbalized understanding.  All questions were answered.  Patient is stable for discharge.   Final Clinical Impressions(s) / UC Diagnoses   Final diagnoses:  Acute cystitis without hematuria     Discharge Instructions      The urinalysis shows that you do have a urinary tract infection.  A urine culture has been ordered to ensure you are being treated with the appropriate antibiotic.  If the medication needs to be changed, you will be contacted. Take medication as prescribed. Make take over-the-counter Tylenol as needed for pain, fever, or general discomfort.  May also continue over-the-counter Azo as needed. Make sure you are drinking at least 8-10 8 ounce glasses of water while symptoms persist. Develop a toileting schedule that allows you to urinate every 2 hours. Avoid caffeine such as tea, soda, and coffee while symptoms persist as this will aggravate your urinary symptoms. If you develop fever, chills, worsening abdominal pain, or worsening urinary symptoms while taking the medication, please go to the emergency department immediately for further evaluation. If symptoms do not improve with this treatment, please follow-up with your primary care physician for further evaluation. Follow-up as  needed.     ED Prescriptions     Medication Sig Dispense Auth. Provider   cephALEXin (KEFLEX) 500 MG capsule Take 1 capsule (500 mg total) by mouth 2 (two) times daily for 7 days. 14 capsule Jerusalem Brownstein-Warren, Sadie Haber, NP      PDMP not reviewed this encounter.   Abran Cantor, NP 02/22/23 1004

## 2023-02-22 NOTE — Discharge Instructions (Signed)
The urinalysis shows that you do have a urinary tract infection.  A urine culture has been ordered to ensure you are being treated with the appropriate antibiotic.  If the medication needs to be changed, you will be contacted. Take medication as prescribed. Make take over-the-counter Tylenol as needed for pain, fever, or general discomfort.  May also continue over-the-counter Azo as needed. Make sure you are drinking at least 8-10 8 ounce glasses of water while symptoms persist. Develop a toileting schedule that allows you to urinate every 2 hours. Avoid caffeine such as tea, soda, and coffee while symptoms persist as this will aggravate your urinary symptoms. If you develop fever, chills, worsening abdominal pain, or worsening urinary symptoms while taking the medication, please go to the emergency department immediately for further evaluation. If symptoms do not improve with this treatment, please follow-up with your primary care physician for further evaluation. Follow-up as needed.

## 2023-02-22 NOTE — ED Triage Notes (Signed)
Pt states through daughter that she has frequent urination, stomach pain and chills. Sx started Wed. Took AZO pills that helped briefly.

## 2023-02-26 ENCOUNTER — Other Ambulatory Visit: Payer: Self-pay

## 2023-02-26 ENCOUNTER — Emergency Department (HOSPITAL_COMMUNITY)
Admission: EM | Admit: 2023-02-26 | Discharge: 2023-02-26 | Disposition: A | Discharge status: Home / Self Care | Payer: Medicare Other | Attending: Emergency Medicine | Admitting: Emergency Medicine

## 2023-02-26 ENCOUNTER — Emergency Department (HOSPITAL_COMMUNITY): Payer: Medicare Other

## 2023-02-26 DIAGNOSIS — R109 Unspecified abdominal pain: Secondary | ICD-10-CM | POA: Diagnosis not present

## 2023-02-26 DIAGNOSIS — Z79899 Other long term (current) drug therapy: Secondary | ICD-10-CM | POA: Insufficient documentation

## 2023-02-26 DIAGNOSIS — M545 Low back pain, unspecified: Secondary | ICD-10-CM | POA: Diagnosis not present

## 2023-02-26 DIAGNOSIS — E039 Hypothyroidism, unspecified: Secondary | ICD-10-CM | POA: Diagnosis not present

## 2023-02-26 DIAGNOSIS — I10 Essential (primary) hypertension: Secondary | ICD-10-CM | POA: Diagnosis not present

## 2023-02-26 LAB — CBC
HCT: 44.2 % (ref 36.0–46.0)
Hemoglobin: 13.5 g/dL (ref 12.0–15.0)
MCH: 27 pg (ref 26.0–34.0)
MCHC: 30.5 g/dL (ref 30.0–36.0)
MCV: 88.4 fL (ref 80.0–100.0)
Platelets: 272 10*3/uL (ref 150–400)
RBC: 5 MIL/uL (ref 3.87–5.11)
RDW: 14.4 % (ref 11.5–15.5)
WBC: 6.6 10*3/uL (ref 4.0–10.5)
nRBC: 0 % (ref 0.0–0.2)

## 2023-02-26 LAB — HEPATIC FUNCTION PANEL
ALT: 29 U/L (ref 0–44)
AST: 26 U/L (ref 15–41)
Albumin: 4.1 g/dL (ref 3.5–5.0)
Alkaline Phosphatase: 107 U/L (ref 38–126)
Bilirubin, Direct: 0.1 mg/dL (ref 0.0–0.2)
Indirect Bilirubin: 0.4 mg/dL (ref 0.3–0.9)
Total Bilirubin: 0.5 mg/dL (ref 0.3–1.2)
Total Protein: 8.3 g/dL — ABNORMAL HIGH (ref 6.5–8.1)

## 2023-02-26 LAB — BASIC METABOLIC PANEL
Anion gap: 9 (ref 5–15)
BUN: 13 mg/dL (ref 8–23)
CO2: 25 mmol/L (ref 22–32)
Calcium: 8.8 mg/dL — ABNORMAL LOW (ref 8.9–10.3)
Chloride: 103 mmol/L (ref 98–111)
Creatinine, Ser: 1.05 mg/dL — ABNORMAL HIGH (ref 0.44–1.00)
GFR, Estimated: 58 mL/min — ABNORMAL LOW (ref >60)
Glucose, Bld: 128 mg/dL — ABNORMAL HIGH (ref 70–99)
Potassium: 3.9 mmol/L (ref 3.5–5.1)
Sodium: 137 mmol/L (ref 135–145)

## 2023-02-26 LAB — URINALYSIS, ROUTINE W REFLEX MICROSCOPIC
Bilirubin Urine: NEGATIVE
Glucose, UA: NEGATIVE mg/dL
Hgb urine dipstick: NEGATIVE
Ketones, ur: NEGATIVE mg/dL
Leukocytes,Ua: NEGATIVE
Nitrite: NEGATIVE
Protein, ur: NEGATIVE mg/dL
Specific Gravity, Urine: 1.009 (ref 1.005–1.030)
pH: 7 (ref 5.0–8.0)

## 2023-02-26 LAB — LIPASE, BLOOD: Lipase: 31 U/L (ref 11–51)

## 2023-02-26 MED ORDER — IOHEXOL 300 MG/ML  SOLN
100.0000 mL | Freq: Once | INTRAMUSCULAR | Status: AC | PRN
  Administered 2023-02-26: 100 mL via INTRAVENOUS

## 2023-02-26 MED ORDER — SODIUM CHLORIDE 0.9 % IV BOLUS
1000.0000 mL | Freq: Once | INTRAVENOUS | Status: AC
  Administered 2023-02-26: 1000 mL via INTRAVENOUS

## 2023-02-26 NOTE — Discharge Instructions (Signed)
Evaluation for your back pain and abdominal pain was overall reassuring.  CT scan was negative for any acute process.  Recommend that you do still continue to take your Keflex as prescribed for your UTI but your urinalysis is indicating that you are infection is improving.  If you start to develop fever, nausea vomiting diarrhea, blood in the urine stool or emesis or any other concerning symptom please return emergency department for further evaluation.

## 2023-02-26 NOTE — ED Provider Triage Note (Signed)
Lower back pain emergency Medicine Provider Triage Evaluation Note  Jamie Torres , a 67 y.o. female  was evaluated in triage.  Pt complains of abdominal pain and back pain.  Started yesterday.  Denies trauma, saddle anesthesia, fever, urinary or bowel incontinence or retention.  Back pain is all along the lower back and she states that radiates to her abdomen.  Denies urinary changes.  Denies nausea vomiting diarrhea.  Review of Systems  Positive: See above Negative: See above  Physical Exam  BP (!) 147/74 (BP Location: Right Arm)   Pulse 74   Temp 98.4 F (36.9 C) (Oral)   Resp 14   SpO2 99%  Gen:   Awake, no distress   Resp:  Normal effort  MSK:   Moves extremities without difficulty  Other:  Generalized lower abdominal tenderness  Medical Decision Making  Medically screening exam initiated at 2:52 PM.  Appropriate orders placed.  Jamie Torres was informed that the remainder of the evaluation will be completed by another provider, this initial triage assessment does not replace that evaluation, and the importance of remaining in the ED until their evaluation is complete.  Work up started   Gareth Eagle, PA-C 02/26/23 1454

## 2023-02-26 NOTE — ED Provider Notes (Signed)
[place] [facility]ERGENCY DEPARTMENT AT [facility] Provider Note   CSN: [id number] Arrival date & time: 02/26/23  1323     History  Chief Complaint  Patient presents with   Back Pain   HPI Jamie Torres is a 67 y.o. female with hypertension, hypothyroidism and hypercholesteremia presenting for lower back and abdominal pain. Started yesterday. Back pain is all along the lower back and she states that radiates to her abdomen. Denies trauma, saddle anesthesia, fever, urinary or bowel incontinence or retention. Denies urinary changes. Denies nausea vomiting diarrhea.  Seen at urgent care on Sunday for UTI.  States she has been taking the antibiotic she was prescribed at that time.   Back Pain      Home Medications Prior to Admission medications   Medication Sig Start Date End Date Taking? Authorizing Provider  amLODipine (NORVASC) 5 MG tablet Take 1 tablet (5 mg total) by mouth daily. 02/04/23   Tommie Sams, DO  cephALEXin (KEFLEX) 500 MG capsule Take 1 capsule (500 mg total) by mouth 2 (two) times daily for 7 days. 02/22/23 03/01/23  Leath-Warren, Sadie Haber, NP  etodolac (LODINE) 400 MG tablet Take 1 tablet (400 mg total) by mouth 2 (two) times daily. 02/04/23   Tommie Sams, DO  fish oil-omega-3 fatty acids 1000 MG capsule Take 2 g by mouth daily.    [provider]  levothyroxine (SYNTHROID) 88 MCG tablet TAKE 1 TABLET(88 MCG) BY MOUTH DAILY 02/04/23   Everlene Other G, DO  pantoprazole (PROTONIX) 40 MG tablet Take 1 tablet (40 mg total) by mouth daily. 02/04/23   Tommie Sams, DO  rosuvastatin (CRESTOR) 10 MG tablet Take 1 tablet (10 mg total) by mouth daily. 02/04/23   Tommie Sams, DO      Allergies    Patient has no known allergies.    Review of Systems   Review of Systems  Musculoskeletal:  Positive for back pain.    Physical Exam   Vitals:   02/26/23 1700 02/26/23 1915  BP: 123/67 133/74  Pulse: 65 72  Resp: 14 18  Temp:  97.7 F (36.5 C)  SpO2:  97% 94%    CONSTITUTIONAL:  well-appearing, NAD NEURO:  Alert and oriented x 3, CN 3-12 grossly intact EYES:  eyes equal and reactive ENT/NECK:  Supple, no stridor  CARDIO:  regular rate and rhythm, appears well-perfused  PULM:  No respiratory distress, CTAB GI/GU:  non-distended, soft, generalized lower abdominal tenderness, no cva tenderness MSK/SPINE:  No gross deformities, no edema, moves all extremities  SKIN:  no rash, atraumatic  *Additional and/or pertinent findings included in MDM below  ED Results / Procedures / Treatments   Labs (all labs ordered are listed, but only abnormal results are displayed) Labs Reviewed  BASIC METABOLIC PANEL - Abnormal; Notable for the following components:      Result Value   Glucose, Bld 128 (*)    Creatinine, Ser 1.05 (*)    Calcium 8.8 (*)    GFR, Estimated 58 (*)    All other components within normal limits  HEPATIC FUNCTION PANEL - Abnormal; Notable for the following components:   Total Protein 8.3 (*)    All other components within normal limits  URINE CULTURE  CBC  URINALYSIS, ROUTINE W REFLEX MICROSCOPIC  LIPASE, BLOOD    EKG None  Radiology CT L-SPINE NO CHARGE  Result Date: 02/26/2023 CLINICAL DATA:  Back pain, being treated for UTI EXAM: CT LUMBAR SPINE WITHOUT  CONTRAST TECHNIQUE: Multidetector CT imaging of the lumbar spine was performed without intravenous contrast administration. Multiplanar CT image reconstructions were also generated. RADIATION DOSE REDUCTION: This exam was performed according to the departmental dose-optimization program which includes automated exposure control, adjustment of the mA and/or kV according to patient size and/or use of iterative reconstruction technique. COMPARISON:  None Available. FINDINGS: Segmentation: There are 5 non-rib-bearing vertebrae in the lumbar region. Alignment: There is possible minimal 1-2 mm anterolisthesis at L5-S1 level. There is no demonstrable defect in pars  interarticularis. Vertebrae: No recent fracture is seen. Coarsening of trabeculae in the lower thoracic vertebral bodies may suggest hemangiomas. Paraspinal and other soft tissues: There is no significant central spinal stenosis. Paraspinal soft tissues are unremarkable. Disc levels: There is no significant disc space narrowing. There is no significant narrowing of neural foramina. IMPRESSION: No recent fracture is seen in the lumbar spine. There is no significant spinal stenosis. There is no significant narrowing of neural foramina. Electronically Signed   By: Ernie Avena M.D.   On: 02/26/2023 18:11   CT ABDOMEN PELVIS W CONTRAST  Result Date: 02/26/2023 CLINICAL DATA:  Abdominal pain EXAM: CT ABDOMEN AND PELVIS WITH CONTRAST TECHNIQUE: Multidetector CT imaging of the abdomen and pelvis was performed using the standard protocol following bolus administration of intravenous contrast. RADIATION DOSE REDUCTION: This exam was performed according to the departmental dose-optimization program which includes automated exposure control, adjustment of the mA and/or kV according to patient size and/or use of iterative reconstruction technique. CONTRAST:  OMNIPAQUE IOHEXOL 300 MG/ML  SOLN COMPARISON:  Abdominal sonogram done on 06/12/2021 FINDINGS: Lower chest: Visualized lower lung fields are clear. Hepatobiliary: No focal abnormalities are seen in liver. There is no dilation of bile ducts. Gallbladder is not distended. Pancreas: No focal abnormalities are seen. Spleen: Unremarkable. Adrenals/Urinary Tract: Adrenals are unremarkable. There is slight prominence of the renal pelvis in both kidneys without significant dilation of minor calyces. There are no renal or ureteral stones. Urinary bladder is unremarkable. Stomach/Bowel: There is small to moderate sized hiatal hernia. Small bowel loops are not dilated. Appendix is not dilated. There is no significant wall thickening in colon. There is no pericolic  stranding or fluid collection. Diverticula are seen in colon without signs of focal acute diverticulitis. There is no pericolic stranding. Vascular/Lymphatic: Scattered arterial calcifications are seen. Reproductive: Uterus is to the right of midline. Other: There is no ascites or pneumoperitoneum. Small umbilical hernia containing fat is seen. Musculoskeletal: No acute findings are seen. IMPRESSION: There is no evidence of intestinal obstruction or pneumoperitoneum. There is no hydronephrosis. Appendix is not dilated. Small to moderate sized hiatal hernia.  Diverticulosis of colon. Electronically Signed   By: Ernie Avena M.D.   On: 02/26/2023 18:05    Procedures Procedures    Medications Ordered in ED Medications  iohexol (OMNIPAQUE) 300 MG/ML solution 100 mL (100 mLs Intravenous Contrast Given 02/26/23 1716)  sodium chloride 0.9 % bolus 1,000 mL (0 mLs Intravenous Stopped 02/26/23 1915)    ED Course/ Medical Decision Making/ A&P                             Medical Decision Making Amount and/or Complexity of Data Reviewed Labs: ordered. Radiology: ordered.  Risk Prescription drug management.   Initial Impression and Ddx 67 year old well-appearing female presenting for abdominal and lower back pain.  Exam notable for generalized lower abdominal tenderness.  DDx includes appendicitis, diverticulitis, ovarian torsion,  nephrolithiasis or pyelonephritis, cauda equina syndrome. Patient PMH that increases complexity of ED encounter:  hypertension, hypothyroidism and hypercholesteremia and recent history of UTI  Interpretation of Diagnostics -I independent reviewed and interpreted the labs as followed: elevated creatine  - I independently visualized the following imaging with scope of interpretation limited to determining acute life threatening conditions related to emergency care: CT scans, which revealed no acute abnormalities  Patient Reassessment and Ultimate  Disposition/Management Overall, patient appears clinically well and abdominal pain appears mild in nature.  Volume resuscitated with normal saline bolus given elevated creatinine suggestive of mild AKI.  Scans were unremarkable.  Advised to follow-up with PCP for abdominal pain and back pain.  Vitals remained stable during encounter.  Discussed pertinent return precautions.  Patient management required discussion with the following services or consulting groups:  None  Complexity of Problems Addressed Acute complicated illness or Injury  Additional Data Reviewed and Analyzed Further history obtained from: Further history from spouse/family member and Past medical history and medications listed in the EMR  Patient Encounter Risk Assessment None         Final Clinical Impression(s) / ED Diagnoses Final diagnoses:  Low back pain, unspecified back pain laterality, unspecified chronicity, unspecified whether sciatica present  Abdominal pain, unspecified abdominal location    Rx / DC Orders ED Discharge Orders     None         Gareth Eagle, PA-C 02/27/23 0912    Eber Hong, MD 02/28/23 0800

## 2023-02-26 NOTE — ED Triage Notes (Signed)
Pt's daughter states she took the pt to urgent care on Sunday for UTI.  Has been taking the antibiotics but is still complaining of pain in her lower back and abdomen.  Also reports difficulty walking.

## 2023-02-28 LAB — URINE CULTURE: Culture: NO GROWTH

## 2023-03-06 ENCOUNTER — Telehealth: Payer: Self-pay

## 2023-03-06 NOTE — Telephone Encounter (Signed)
Transition Care Management Follow-up Telephone Call Date of discharge and from where: Jamie Torres 5/16 How have you been since you were released from the hospital? Good  Any questions or concerns? No  Items Reviewed: Did the pt receive and understand the discharge instructions provided? Yes  Medications obtained and verified? No  Other? No  Any new allergies since your discharge? No  Dietary orders reviewed? No Do you have support at home? Yes     Follow up appointments reviewed:  PCP Hospital f/u appt confirmed? Yes  Scheduled to see  on  @ . Specialist Hospital f/u appt confirmed? No  Scheduled to see  on  @ . Are transportation arrangements needed? No  If their condition worsens, is the pt aware to call PCP or go to the Emergency Dept.? Yes Was the patient provided with contact information for the PCP's office or ED? Yes Was to pt encouraged to call back with questions or concerns? Yes

## 2023-03-15 ENCOUNTER — Other Ambulatory Visit: Payer: Self-pay | Admitting: Family Medicine

## 2023-03-15 DIAGNOSIS — M25541 Pain in joints of right hand: Secondary | ICD-10-CM

## 2023-03-15 DIAGNOSIS — M25542 Pain in joints of left hand: Secondary | ICD-10-CM

## 2023-03-19 ENCOUNTER — Other Ambulatory Visit: Payer: Self-pay | Admitting: Nurse Practitioner

## 2023-03-19 ENCOUNTER — Other Ambulatory Visit: Payer: Self-pay | Admitting: Family Medicine

## 2023-03-19 ENCOUNTER — Telehealth: Payer: Self-pay

## 2023-03-19 DIAGNOSIS — E039 Hypothyroidism, unspecified: Secondary | ICD-10-CM

## 2023-03-19 MED ORDER — ETODOLAC 400 MG PO TABS: 400.0000 mg | ORAL_TABLET | Freq: Two times a day (BID) | ORAL | 0 refills | Status: DC

## 2023-03-19 NOTE — Telephone Encounter (Signed)
Prescription Request  03/19/2023  LOV: Visit date not found  What is the name of the medication or equipment? etodolac (LODINE) 400 MG tablet   Have you contacted your pharmacy to request a refill? Yes   Which pharmacy would you like this sent to?  [id number] - [place], [place] - [address] [place] [address] Phone: [phone] Fax: [phone]    Patient notified that their request is being sent to the clinical staff for review and that they should receive a response within 2 business days.   Please advise at Mobile [phone] (mobile)

## 2023-03-19 NOTE — Telephone Encounter (Signed)
Refill complete 

## 2023-03-26 ENCOUNTER — Telehealth: Payer: Self-pay | Admitting: Nurse Practitioner

## 2023-03-26 NOTE — Telephone Encounter (Signed)
Patient requesting refill on etodolac (LODINE) 400 MG tablet  sent to [facility]. She is stating that they didn't get medication can you resend in .

## 2023-03-27 MED ORDER — ETODOLAC 400 MG PO TABS: 400.0000 mg | ORAL_TABLET | Freq: Two times a day (BID) | ORAL | 0 refills | Status: DC

## 2023-03-28 ENCOUNTER — Other Ambulatory Visit: Payer: Self-pay | Admitting: Family Medicine

## 2023-04-01 ENCOUNTER — Ambulatory Visit (INDEPENDENT_AMBULATORY_CARE_PROVIDER_SITE_OTHER): Payer: Medicare Other | Admitting: Family Medicine

## 2023-04-01 ENCOUNTER — Encounter: Payer: Self-pay | Admitting: Family Medicine

## 2023-04-01 VITALS — BP 112/75 | Temp 98.2°F | Ht 60.0 in | Wt 136.0 lb

## 2023-04-01 DIAGNOSIS — M25561 Pain in right knee: Secondary | ICD-10-CM

## 2023-04-01 DIAGNOSIS — I10 Essential (primary) hypertension: Secondary | ICD-10-CM | POA: Diagnosis not present

## 2023-04-01 DIAGNOSIS — E782 Mixed hyperlipidemia: Secondary | ICD-10-CM | POA: Diagnosis not present

## 2023-04-01 DIAGNOSIS — M25562 Pain in left knee: Secondary | ICD-10-CM

## 2023-04-01 DIAGNOSIS — E039 Hypothyroidism, unspecified: Secondary | ICD-10-CM

## 2023-04-01 DIAGNOSIS — R7309 Other abnormal glucose: Secondary | ICD-10-CM

## 2023-04-01 MED ORDER — DICLOFENAC SODIUM 75 MG PO TBEC: 75.0000 mg | DELAYED_RELEASE_TABLET | Freq: Two times a day (BID) | ORAL | 3 refills | Status: DC | PRN

## 2023-04-01 NOTE — Progress Notes (Signed)
Subjective:  Patient ID: Jamie Torres, female    DOB: 06-Feb-1956  Age: 67 y.o. MRN: [id number]  CC: Chief Complaint  Patient presents with   Hypertension   Hand Pain    Rx for diclofenac    HPI:  67 year old female with hypertension, hepatic steatosis, hypothyroidism, hyperlipidemia, arthritis presents for follow-up.  Patient is accompanied by a [place] translator today.  Patient states that overall she is doing well.  However, she feels like diclofenac works better than etodolac.  She would like to switch back to diclofenac.  She is most bothered by knee pain and pain in her hands.  She is no longer working.  Blood pressure is well-controlled on amlodipine.  Lipids have been well-controlled on Crestor.  Patient has been stable on levothyroxine 88 mcg.  Needs labs.  Patient Active Problem List   Diagnosis Date Noted   Acute pain of both knees 02/04/2023   Female genital prolapse 09/18/2022   Hepatic steatosis 09/17/2021   Essential hypertension 11/13/2020   Mixed hyperlipidemia 05/25/2013   Hypothyroidism 09/02/2010    Social Hx   Social History   Socioeconomic History   Marital status: Married    Spouse name: Not on file   Number of children: Not on file   Years of education: Not on file   Highest education level: Not on file  Occupational History   Not on file  Tobacco Use   Smoking status: Never   Smokeless tobacco: Never  Vaping Use   Vaping Use: Never used  Substance and Sexual Activity   Alcohol use: No   Drug use: No   Sexual activity: Not on file  Other Topics Concern   Not on file  Social History Narrative   Not on file   Social Determinants of Health   Financial Resource Strain: Not on file  Food Insecurity: Not on file  Transportation Needs: Not on file  Physical Activity: Not on file  Stress: Not on file  Social Connections: Not on file    Review of Systems Per HPI  Objective:  BP 112/75   Temp 98.2 F (36.8 C)   Ht 5'  (1.524 m)   Wt 136 lb (61.7 kg)   SpO2 99%   BMI 26.56 kg/m      04/01/2023    9:31 AM 02/26/2023    7:15 PM 02/26/2023    5:00 PM  BP/Weight  Systolic BP 112 133 123  Diastolic BP 75 74 67  Wt. (Lbs) 136    BMI 26.56 kg/m2      Physical Exam Vitals and nursing note reviewed.  Constitutional:      General: She is not in acute distress.    Appearance: Normal appearance.  HENT:     Head: Normocephalic and atraumatic.  Eyes:     General:        Right eye: No discharge.        Left eye: No discharge.     Conjunctiva/sclera: Conjunctivae normal.  Cardiovascular:     Rate and Rhythm: Normal rate and regular rhythm.  Pulmonary:     Effort: Pulmonary effort is normal.     Breath sounds: Normal breath sounds. No wheezing, rhonchi or rales.  Neurological:     Mental Status: She is alert.  Psychiatric:        Mood and Affect: Mood normal.        Behavior: Behavior normal.     Lab Results  Component Value Date  WBC 6.6 02/26/2023   HGB 13.5 02/26/2023   HCT 44.2 02/26/2023   PLT 272 02/26/2023   GLUCOSE 128 (H) 02/26/2023   CHOL 134 03/20/2022   TRIG 133 03/20/2022   HDL 34 (L) 03/20/2022   LDLCALC 76 03/20/2022   ALT 29 02/26/2023   AST 26 02/26/2023   NA 137 02/26/2023   K 3.9 02/26/2023   CL 103 02/26/2023   CREATININE 1.05 (H) 02/26/2023   BUN 13 02/26/2023   CO2 25 02/26/2023   TSH 1.890 09/19/2022     Assessment & Plan:   Problem List Items Addressed This Visit       Cardiovascular and Mediastinum   Essential hypertension - Primary    Stable.  Continue amlodipine.       Relevant Orders   CMP14+EGFR     Endocrine   Hypothyroidism    TSH to assess.  Continue current dosing of levothyroxine.      Relevant Orders   TSH     Other   Mixed hyperlipidemia    Fairly good control on Crestor.  Continue.      Relevant Orders   Lipid panel   Acute pain of both knees    Switching to diclofenac.      Other Visit Diagnoses     Elevated  glucose       Relevant Orders   Hemoglobin A1c       Meds ordered this encounter  Medications   diclofenac (VOLTAREN) 75 MG EC tablet    Sig: Take 1 tablet (75 mg total) by mouth 2 (two) times daily as needed for moderate pain.    Dispense:  60 tablet    Refill:  3    Follow-up:  Return in about 6 months (around 10/01/2023).  Everlene Other DO [facility]

## 2023-04-01 NOTE — Assessment & Plan Note (Signed)
TSH to assess.  Continue current dosing of levothyroxine.

## 2023-04-01 NOTE — Assessment & Plan Note (Signed)
Fairly good control on Crestor.  Continue.

## 2023-04-01 NOTE — Patient Instructions (Signed)
Labs today. ? ?Medication as prescribed. ? ?Follow up in 6 months. ?

## 2023-04-01 NOTE — Assessment & Plan Note (Addendum)
Stable. °-Continue amlodipine °

## 2023-04-01 NOTE — Assessment & Plan Note (Signed)
Switching to diclofenac. 

## 2023-04-03 DIAGNOSIS — E039 Hypothyroidism, unspecified: Secondary | ICD-10-CM | POA: Diagnosis not present

## 2023-04-03 DIAGNOSIS — I10 Essential (primary) hypertension: Secondary | ICD-10-CM | POA: Diagnosis not present

## 2023-04-03 DIAGNOSIS — E782 Mixed hyperlipidemia: Secondary | ICD-10-CM | POA: Diagnosis not present

## 2023-04-03 DIAGNOSIS — R7309 Other abnormal glucose: Secondary | ICD-10-CM | POA: Diagnosis not present

## 2023-04-04 LAB — CMP14+EGFR
ALT: 19 IU/L (ref 0–32)
AST: 21 IU/L (ref 0–40)
Albumin: 4.2 g/dL (ref 3.9–4.9)
Alkaline Phosphatase: 115 IU/L (ref 44–121)
BUN/Creatinine Ratio: 24 (ref 12–28)
BUN: 16 mg/dL (ref 8–27)
Bilirubin Total: 0.4 mg/dL (ref 0.0–1.2)
CO2: 21 mmol/L (ref 20–29)
Calcium: 9.4 mg/dL (ref 8.7–10.3)
Chloride: 103 mmol/L (ref 96–106)
Creatinine, Ser: 0.68 mg/dL (ref 0.57–1.00)
Globulin, Total: 3.3 g/dL (ref 1.5–4.5)
Glucose: 113 mg/dL — ABNORMAL HIGH (ref 70–99)
Potassium: 4.4 mmol/L (ref 3.5–5.2)
Sodium: 138 mmol/L (ref 134–144)
Total Protein: 7.5 g/dL (ref 6.0–8.5)
eGFR: 95 mL/min/{1.73_m2} (ref >59)

## 2023-04-04 LAB — LIPID PANEL
Chol/HDL Ratio: 2.8 ratio (ref 0.0–4.4)
Cholesterol, Total: 96 mg/dL — ABNORMAL LOW (ref 100–199)
HDL: 34 mg/dL — ABNORMAL LOW (ref >39)
LDL Chol Calc (NIH): 42 mg/dL (ref 0–99)
Triglycerides: 104 mg/dL (ref 0–149)
VLDL Cholesterol Cal: 20 mg/dL (ref 5–40)

## 2023-04-04 LAB — HEMOGLOBIN A1C
Est. average glucose Bld gHb Est-mCnc: 137 mg/dL
Hgb A1c MFr Bld: 6.4 % — ABNORMAL HIGH (ref 4.8–5.6)

## 2023-04-04 LAB — TSH: TSH: 0.292 u[IU]/mL — ABNORMAL LOW (ref 0.450–4.500)

## 2023-04-08 ENCOUNTER — Other Ambulatory Visit: Payer: Self-pay

## 2023-04-08 MED ORDER — LEVOTHYROXINE SODIUM 75 MCG PO TABS: 75.0000 ug | ORAL_TABLET | Freq: Every day | ORAL | 1 refills | Status: DC

## 2023-05-20 NOTE — Progress Notes (Signed)
This encounter was created in error - please disregard.

## 2023-07-09 ENCOUNTER — Other Ambulatory Visit: Payer: Self-pay | Admitting: Family Medicine

## 2023-07-13 ENCOUNTER — Other Ambulatory Visit (HOSPITAL_COMMUNITY): Payer: Self-pay | Admitting: Family Medicine

## 2023-07-13 DIAGNOSIS — Z1231 Encounter for screening mammogram for malignant neoplasm of breast: Secondary | ICD-10-CM

## 2023-07-22 ENCOUNTER — Ambulatory Visit (HOSPITAL_COMMUNITY)
Admission: RE | Admit: 2023-07-22 | Discharge: 2023-07-22 | Disposition: A | Discharge status: Home / Self Care | Payer: Medicare Other | Source: Ambulatory Visit | Attending: Family Medicine | Admitting: Family Medicine

## 2023-07-22 DIAGNOSIS — Z1231 Encounter for screening mammogram for malignant neoplasm of breast: Secondary | ICD-10-CM | POA: Diagnosis not present

## 2023-07-24 ENCOUNTER — Other Ambulatory Visit: Payer: Self-pay | Admitting: Family Medicine

## 2023-09-11 DIAGNOSIS — H2513 Age-related nuclear cataract, bilateral: Secondary | ICD-10-CM | POA: Diagnosis not present

## 2023-09-21 DIAGNOSIS — H2513 Age-related nuclear cataract, bilateral: Secondary | ICD-10-CM | POA: Diagnosis not present

## 2023-10-01 ENCOUNTER — Ambulatory Visit (INDEPENDENT_AMBULATORY_CARE_PROVIDER_SITE_OTHER): Payer: Medicare Other | Admitting: Family Medicine

## 2023-10-01 DIAGNOSIS — G8929 Other chronic pain: Secondary | ICD-10-CM | POA: Insufficient documentation

## 2023-10-01 DIAGNOSIS — M79605 Pain in left leg: Secondary | ICD-10-CM | POA: Diagnosis not present

## 2023-10-01 DIAGNOSIS — E782 Mixed hyperlipidemia: Secondary | ICD-10-CM | POA: Diagnosis not present

## 2023-10-01 DIAGNOSIS — I10 Essential (primary) hypertension: Secondary | ICD-10-CM

## 2023-10-01 DIAGNOSIS — E039 Hypothyroidism, unspecified: Secondary | ICD-10-CM

## 2023-10-01 DIAGNOSIS — Z780 Asymptomatic menopausal state: Secondary | ICD-10-CM

## 2023-10-01 DIAGNOSIS — R7303 Prediabetes: Secondary | ICD-10-CM | POA: Insufficient documentation

## 2023-10-01 MED ORDER — PANTOPRAZOLE SODIUM 40 MG PO TBEC: 40.0000 mg | DELAYED_RELEASE_TABLET | Freq: Every day | ORAL | 1 refills | Status: AC | PRN

## 2023-10-01 MED ORDER — DICLOFENAC SODIUM 75 MG PO TBEC: 75.0000 mg | DELAYED_RELEASE_TABLET | Freq: Two times a day (BID) | ORAL | 0 refills | Status: DC | PRN

## 2023-10-01 MED ORDER — PREDNISONE 10 MG PO TABS: ORAL_TABLET | ORAL | 0 refills | Status: DC

## 2023-10-01 NOTE — Assessment & Plan Note (Signed)
At goal on Crestor.  Continue. 

## 2023-10-01 NOTE — Assessment & Plan Note (Signed)
TSH today to assess.  We will adjust levothyroxine dose based on lab results.

## 2023-10-01 NOTE — Progress Notes (Signed)
Subjective:  Patient ID: Jamie Torres, female    DOB: [birth year]  Age: 67 y.o. MRN: [id number]  CC:   Chief Complaint  Patient presents with   Hypertension   Hypothyroidism    HPI:  67 year old female presents for follow-up.  Patient reports ongoing pain in the left lower extremity.  She is concerned about sciatica.  Affects the lateral thigh and down to the lower leg.  Has been going on for a year.  She has had a CT scan of her lumbar spine earlier in the year which did not reveal any significant abnormalities.  Patient's blood pressure is well-controlled on amlodipine.  Lipids at goal on Crestor.  Needs A1c today regarding prediabetes.  Also needs TSH to reassess hypothyroidism.  Last TSH was suppressed.  Patient is in need of a DEXA scan.  Patient Active Problem List   Diagnosis Date Noted   Prediabetes 10/01/2023   Chronic pain of left lower extremity 10/01/2023   Female genital prolapse 09/18/2022   Hepatic steatosis 09/17/2021   Essential hypertension 11/13/2020   Mixed hyperlipidemia 05/25/2013   Hypothyroidism 09/02/2010    Social Hx   Social History   Socioeconomic History   Marital status: Married    Spouse name: Not on file   Number of children: Not on file   Years of education: Not on file   Highest education level: Not on file  Occupational History   Not on file  Tobacco Use   Smoking status: Never   Smokeless tobacco: Never  Vaping Use   Vaping status: Never Used  Substance and Sexual Activity   Alcohol use: No   Drug use: No   Sexual activity: Not on file  Other Topics Concern   Not on file  Social History Narrative   Not on file   Social Drivers of Health   Financial Resource Strain: Not on file  Food Insecurity: Not on file  Transportation Needs: Not on file  Physical Activity: Not on file  Stress: Not on file  Social Connections: Not on file    Review of Systems Per HPI  Objective:  BP 128/79   Pulse 70   Temp 98.2 F  (36.8 C)   Ht 5' (1.524 m)   Wt 138 lb (62.6 kg)   SpO2 100%   BMI 26.95 kg/m      10/01/2023    8:37 AM 04/01/2023    9:31 AM 02/26/2023    7:15 PM  BP/Weight  Systolic BP 128 112 133  Diastolic BP 79 75 74  Wt. (Lbs) 138 136   BMI 26.95 kg/m2 26.56 kg/m2     Physical Exam Vitals and nursing note reviewed.  Constitutional:      General: She is not in acute distress.    Appearance: Normal appearance.  HENT:     Head: Normocephalic and atraumatic.  Eyes:     General:        Right eye: No discharge.        Left eye: No discharge.     Conjunctiva/sclera: Conjunctivae normal.  Cardiovascular:     Rate and Rhythm: Normal rate and regular rhythm.  Pulmonary:     Effort: Pulmonary effort is normal.     Breath sounds: Normal breath sounds. No wheezing, rhonchi or rales.  Neurological:     Mental Status: She is alert.  Psychiatric:        Mood and Affect: Mood normal.        Behavior:  Behavior normal.     Lab Results  Component Value Date   WBC 6.6 02/26/2023   HGB 13.5 02/26/2023   HCT 44.2 02/26/2023   PLT 272 02/26/2023   GLUCOSE 113 (H) 04/03/2023   CHOL 96 (L) 04/03/2023   TRIG 104 04/03/2023   HDL 34 (L) 04/03/2023   LDLCALC 42 04/03/2023   ALT 19 04/03/2023   AST 21 04/03/2023   NA 138 04/03/2023   K 4.4 04/03/2023   CL 103 04/03/2023   CREATININE 0.68 04/03/2023   BUN 16 04/03/2023   CO2 21 04/03/2023   TSH 0.292 (L) 04/03/2023   HGBA1C 6.4 (H) 04/03/2023     Assessment & Plan:   Problem List Items Addressed This Visit       Cardiovascular and Mediastinum   Essential hypertension   Stable.  Continue amlodipine.        Endocrine   Hypothyroidism   TSH today to assess.  We will adjust levothyroxine dose based on lab results.      Relevant Orders   TSH     Other   Chronic pain of left lower extremity   Lumbar spine obtained in May of this year with no significant abnormalities.  Trial of prednisone.      Relevant Medications    predniSONE (DELTASONE) 10 MG tablet   diclofenac (VOLTAREN) 75 MG EC tablet   Mixed hyperlipidemia   At goal on Crestor.  Continue.      Prediabetes   A1c today to assess.      Relevant Orders   Hemoglobin A1c   Other Visit Diagnoses       Post-menopausal       Relevant Orders   DG Bone Density       Meds ordered this encounter  Medications   predniSONE (DELTASONE) 10 MG tablet    Sig: 50 mg daily x 2 days, then 40 mg daily x 2 days, then 30 mg daily x 2 days, then 20 mg daily x 2 days, then 10 mg daily x 2 days.    Dispense:  30 tablet    Refill:  0   pantoprazole (PROTONIX) 40 MG tablet    Sig: Take 1 tablet (40 mg total) by mouth daily as needed.    Dispense:  90 tablet    Refill:  1   diclofenac (VOLTAREN) 75 MG EC tablet    Sig: Take 1 tablet (75 mg total) by mouth 2 (two) times daily as needed for moderate pain (pain score 4-6).    Dispense:  60 tablet    Refill:  0    Follow-up:  6 months  Marquese Burkland Adriana Simas DO [facility]

## 2023-10-01 NOTE — Patient Instructions (Signed)
Medication as prescribed.  Labs today.  I have ordered screening for osteoporosis. Call [phone] to schedule.  Follow up in 6 months.

## 2023-10-01 NOTE — Assessment & Plan Note (Signed)
A1c today to assess. 

## 2023-10-01 NOTE — Assessment & Plan Note (Signed)
Stable. °-Continue amlodipine °

## 2023-10-01 NOTE — Assessment & Plan Note (Signed)
Lumbar spine obtained in May of this year with no significant abnormalities.  Trial of prednisone.

## 2023-10-02 ENCOUNTER — Other Ambulatory Visit: Payer: Self-pay | Admitting: Family Medicine

## 2023-10-02 LAB — TSH: TSH: 6.75 u[IU]/mL — ABNORMAL HIGH (ref 0.450–4.500)

## 2023-10-02 LAB — HEMOGLOBIN A1C
Est. average glucose Bld gHb Est-mCnc: 143 mg/dL
Hgb A1c MFr Bld: 6.6 % — ABNORMAL HIGH (ref 4.8–5.6)

## 2023-10-02 MED ORDER — METFORMIN HCL 500 MG PO TABS
500.0000 mg | ORAL_TABLET | Freq: Two times a day (BID) | ORAL | 3 refills | Status: DC
Start: 2023-10-02 — End: 2024-04-03

## 2023-10-02 MED ORDER — LEVOTHYROXINE SODIUM 88 MCG PO TABS: 88.0000 ug | ORAL_TABLET | Freq: Every day | ORAL | 0 refills | Status: DC

## 2023-10-16 ENCOUNTER — Ambulatory Visit
Admission: EM | Admit: 2023-10-16 | Discharge: 2023-10-16 | Disposition: A | Discharge status: Home / Self Care | Payer: Medicare Other | Attending: Nurse Practitioner | Admitting: Nurse Practitioner

## 2023-10-16 DIAGNOSIS — J0100 Acute maxillary sinusitis, unspecified: Secondary | ICD-10-CM | POA: Diagnosis not present

## 2023-10-16 DIAGNOSIS — R059 Cough, unspecified: Secondary | ICD-10-CM

## 2023-10-16 MED ORDER — FLUTICASONE PROPIONATE 50 MCG/ACT NA SUSP: 2.0000 | Freq: Every day | NASAL | 0 refills | Status: DC

## 2023-10-16 MED ORDER — BENZONATATE 100 MG PO CAPS: 100.0000 mg | ORAL_CAPSULE | Freq: Three times a day (TID) | ORAL | 0 refills | Status: DC

## 2023-10-16 MED ORDER — AMOXICILLIN-POT CLAVULANATE 875-125 MG PO TABS: 1.0000 | ORAL_TABLET | Freq: Two times a day (BID) | ORAL | 0 refills | Status: DC

## 2023-10-16 NOTE — Discharge Instructions (Addendum)
 Take medication as directed. Increase fluids and get plenty of rest. May take over-the-counter  Tylenol  as needed for pain, fever, or general discomfort. Recommend normal saline nasal spray to help with nasal congestion throughout the day. For your cough, it may be helpful to use a humidifier at bedtime during sleep. If symptoms fail to improve with this treatment, you may follow-up in this clinic or with your PCP for further evaluation.

## 2023-10-16 NOTE — ED Triage Notes (Signed)
 Pt reports cough, nasal congestion, headache, facial pan. Cough started x 2 weeks, congestion and facial pain x 1 week.

## 2023-10-16 NOTE — ED Provider Notes (Signed)
 [facility]    CSN: [id number] Arrival date & time: 10/16/23  0857      History   Chief Complaint No chief complaint on file.   HPI Jamie Torres is a 68 y.o. female.   The history is provided by the patient.   The patient presents for complaints of nasal congestion, headache, nasal congestion, runny nose, cough and facial pain.  Symptoms have been present for the past 2 weeks.  Patient denies fever, chills, ear pain, ear drainage, wheezing, difficulty breathing, chest pain, abdominal pain, nausea, vomiting, diarrhea, or rash.  Patient reports she has been taking over-the-counter cough and cold medications with minimal relief of her symptoms.  Past Medical History:  Diagnosis Date   Arthritis    Hypercholesteremia    Hypertension    Hypothyroidism    Plantar fasciitis    TB (pulmonary tuberculosis)    Thyroid  disease     Patient Active Problem List   Diagnosis Date Noted   Prediabetes 10/01/2023   Chronic pain of left lower extremity 10/01/2023   Female genital prolapse 09/18/2022   Hepatic steatosis 09/17/2021   Essential hypertension 11/13/2020   Mixed hyperlipidemia 05/25/2013   Hypothyroidism 09/02/2010    Past Surgical History:  Procedure Laterality Date   cancerous mole removed from eye     EYE SURGERY     TUBAL LIGATION      OB History     Gravida  4   Para  4   Term  4   Preterm      AB      Living  4      SAB      IAB      Ectopic      Multiple      Live Births               Home Medications    Prior to Admission medications   Medication Sig Start Date End Date Taking? Authorizing Provider  amLODipine  (NORVASC ) 5 MG tablet Take 1 tablet (5 mg total) by mouth daily. 02/04/23   Cook, Jayce G, DO  diclofenac  (VOLTAREN ) 75 MG EC tablet Take 1 tablet (75 mg total) by mouth 2 (two) times daily as needed for moderate pain (pain score 4-6). 10/01/23   Cook, Jayce G, DO  fish oil-omega-3 fatty acids 1000 MG capsule  Take 2 g by mouth daily.    [provider]  levothyroxine  (SYNTHROID ) 88 MCG tablet Take 1 tablet (88 mcg total) by mouth daily. 10/02/23   Cook, Jayce G, DO  metFORMIN  (GLUCOPHAGE ) 500 MG tablet Take 1 tablet (500 mg total) by mouth 2 (two) times daily with a meal. 10/02/23   Cook, Jayce G, DO  pantoprazole  (PROTONIX ) 40 MG tablet Take 1 tablet (40 mg total) by mouth daily as needed. 10/01/23   Cook, Jayce G, DO  predniSONE  (DELTASONE ) 10 MG tablet 50 mg daily x 2 days, then 40 mg daily x 2 days, then 30 mg daily x 2 days, then 20 mg daily x 2 days, then 10 mg daily x 2 days. 10/01/23   Cook, Jayce G, DO  rosuvastatin  (CRESTOR ) 10 MG tablet TAKE 1 TABLET(10 MG) BY MOUTH DAILY 07/10/23   Cook, Jayce G, DO    Family History History reviewed. No pertinent family history.  Social History Social History   Tobacco Use   Smoking status: Never   Smokeless tobacco: Never  Vaping Use   Vaping status: Never Used  Substance Use Topics   Alcohol use: No   Drug use: No     Allergies   Patient has no known allergies.   Review of Systems Review of Systems Per HPI  Physical Exam Triage Vital Signs ED Triage Vitals  Encounter Vitals Group     BP 10/16/23 1012 113/74     Systolic BP Percentile --      Diastolic BP Percentile --      Pulse Rate 10/16/23 1012 91     Resp 10/16/23 1012 17     Temp 10/16/23 1012 98.8 F (37.1 C)     Temp Source 10/16/23 1012 Oral     SpO2 10/16/23 1012 97 %     Weight --      Height --      Head Circumference --      Peak Flow --      Pain Score 10/16/23 1016 0     Pain Loc --      Pain Education --      Exclude from Growth Chart --    No data found.  Updated Vital Signs BP 113/74 (BP Location: Right Arm)   Pulse 91   Temp 98.8 F (37.1 C) (Oral)   Resp 17   SpO2 97%   Visual Acuity Right Eye Distance:   Left Eye Distance:   Bilateral Distance:    Right Eye Near:   Left Eye Near:    Bilateral Near:     Physical  Exam Vitals and nursing note reviewed.  Constitutional:      General: She is not in acute distress.    Appearance: Normal appearance.  HENT:     Head: Normocephalic.     Right Ear: Tympanic membrane, ear canal and external ear normal.     Left Ear: Tympanic membrane, ear canal and external ear normal.     Nose: Congestion present.     Right Turbinates: Enlarged and swollen.     Left Turbinates: Enlarged and swollen.     Right Sinus: Maxillary sinus tenderness present. No frontal sinus tenderness.     Left Sinus: Maxillary sinus tenderness present. No frontal sinus tenderness.     Mouth/Throat:     Lips: Pink.     Mouth: Mucous membranes are moist.     Pharynx: Uvula midline. Postnasal drip present. No pharyngeal swelling, oropharyngeal exudate, posterior oropharyngeal erythema or uvula swelling.     Comments: Cobblestoning present to posterior oropharynx  Eyes:     Extraocular Movements: Extraocular movements intact.     Conjunctiva/sclera: Conjunctivae normal.     Pupils: Pupils are equal, round, and reactive to light.  Cardiovascular:     Rate and Rhythm: Normal rate and regular rhythm.     Pulses: Normal pulses.     Heart sounds: Normal heart sounds.  Pulmonary:     Effort: Pulmonary effort is normal. No respiratory distress.     Breath sounds: Normal breath sounds. No stridor. No wheezing, rhonchi or rales.  Abdominal:     General: Bowel sounds are normal.     Palpations: Abdomen is soft.     Tenderness: There is no abdominal tenderness.  Musculoskeletal:     Cervical back: Normal range of motion.  Lymphadenopathy:     Cervical: No cervical adenopathy.  Skin:    General: Skin is warm and dry.  Neurological:     General: No focal deficit present.     Mental Status: She is alert and oriented to  person, place, and time.  Psychiatric:        Mood and Affect: Mood normal.        Behavior: Behavior normal.      UC Treatments / Results  Labs (all labs ordered are  listed, but only abnormal results are displayed) Labs Reviewed - No data to display  EKG   Radiology No results found.  Procedures Procedures (including critical care time)  Medications Ordered in UC Medications - No data to display  Initial Impression / Assessment and Plan / UC Course  I have reviewed the triage vital signs and the nursing notes.  Pertinent labs & imaging results that were available during my care of the patient were reviewed by me and considered in my medical decision making (see chart for details).  On exam, patient with maxillary sinus tenderness, and postnasal drainage.  Symptoms have been present for the past 2 weeks.  Symptoms consistent with acute maxillary sinusitis.  Will treat with Augmentin  875/125 mg tablets for the next 7 days, fluticasone  50 mcg nasal spray, and Tessalon  100 mg for cough.  Supportive care recommendations were provided and discussed with the patient to include fluids, rest, over-the-counter analgesics, and use of a humidifier during sleep.  Discussed indications regarding return.  Patient was in agreement with this plan of care and verbalized understanding.  All questions were answered.  Patient stable for discharge.  Final Clinical Impressions(s) / UC Diagnoses   Final diagnoses:  None   Discharge Instructions   None    ED Prescriptions   None    PDMP not reviewed this encounter.   Gilmer Etta PARAS, NP 10/16/23 1041

## 2023-10-27 ENCOUNTER — Telehealth: Payer: Medicare Other | Admitting: Family Medicine

## 2023-10-27 DIAGNOSIS — R052 Subacute cough: Secondary | ICD-10-CM

## 2023-10-27 MED ORDER — PROMETHAZINE-DM 6.25-15 MG/5ML PO SYRP: 5.0000 mL | ORAL_SOLUTION | Freq: Four times a day (QID) | ORAL | 0 refills | Status: DC | PRN

## 2023-10-27 MED ORDER — DOXYCYCLINE HYCLATE 100 MG PO TABS
100.0000 mg | ORAL_TABLET | Freq: Two times a day (BID) | ORAL | 0 refills | Status: DC
Start: 2023-10-27 — End: 2023-11-10

## 2023-10-27 NOTE — Progress Notes (Signed)
 Virtual Visit via Video Note  I connected with Jamie Torres on 10/27/23 at  4:10 PM EST by a video enabled telemedicine application and verified that I am speaking with the correct person using two identifiers.  Location: Patient: Home Provider: Office   I discussed the limitations of evaluation and management by telemedicine and the availability of in person appointments. The patient expressed understanding and agreed to proceed.  History of Present Illness: 68 year old female presents for evaluation of respiratory symptoms.  Patient recently seen at urgent care on 1/3.  Diagnosed and treated for sinusitis.  Patient states that she continues to be symptomatic.  Having significant cough which is productive.  No relief with treatment that has been given.  No fever.   Observations/Objective: General: Well-appearing.  No acute distress. Respiratory: No apparent respiratory distress.  Occasional coughing.  Assessment and Plan: 68 year old female presents with persistent cough.  Arranging for chest x-ray.  Placing on doxycycline  to cover for bacterial bronchitis.  Promethazine  DM for cough.  Meds ordered this encounter  Medications   doxycycline  (VIBRA -TABS) 100 MG tablet    Sig: Take 1 tablet (100 mg total) by mouth 2 (two) times daily.    Dispense:  14 tablet    Refill:  0   promethazine -dextromethorphan (PROMETHAZINE -DM) 6.25-15 MG/5ML syrup    Sig: Take 5 mLs by mouth 4 (four) times daily as needed for cough.    Dispense:  118 mL    Refill:  0     Follow Up Instructions:    I discussed the assessment and treatment plan with the patient. The patient was provided an opportunity to ask questions and all were answered. The patient agreed with the plan and demonstrated an understanding of the instructions.   The patient was advised to call back or seek an in-person evaluation if the symptoms worsen or if the condition fails to improve as anticipated.  I provided 5 minutes of  non-face-to-face time during this encounter.   Tamantha Saline G Obrien Huskins, DO

## 2023-10-28 ENCOUNTER — Ambulatory Visit (HOSPITAL_COMMUNITY)
Admission: RE | Admit: 2023-10-28 | Discharge: 2023-10-28 | Disposition: A | Discharge status: Home / Self Care | Payer: Medicare Other | Source: Ambulatory Visit | Attending: Family Medicine | Admitting: Family Medicine

## 2023-10-28 DIAGNOSIS — R052 Subacute cough: Secondary | ICD-10-CM | POA: Insufficient documentation

## 2023-10-28 DIAGNOSIS — R918 Other nonspecific abnormal finding of lung field: Secondary | ICD-10-CM | POA: Diagnosis not present

## 2023-10-28 DIAGNOSIS — R059 Cough, unspecified: Secondary | ICD-10-CM | POA: Diagnosis not present

## 2023-11-02 NOTE — Progress Notes (Signed)
Reviewed pt chart to do pre-op interview for surgery scheduled @ [facility] on 11-11-2023 by Dr Valentina Shaggy.  Noted patient had telemedicine visit with her pcp, Dr Tommy Rainwater , on 10-27-2023 diagnosis with bacterial bronchitis and CXR done on 10-28-2023 which showed bronchopneumonia.  Per URI guidelines per would need to reschedule surgery. Reviewed with anesthesia, Dr Lacretia Nicks. Hyacinth Meeker MDA,  stated that would go by CXR that patient has bronchopneumonia, which per guidelines , patient needs to be rescheduled 8 weeks from diagnosis. Called and left message for [place], [place] scheduler for Dr Rana Snare, informed her of anesthesia recommendation per URI guidelines to reschedule and call if any questions.

## 2023-11-09 ENCOUNTER — Ambulatory Visit: Payer: Self-pay | Admitting: Family Medicine

## 2023-11-09 NOTE — Telephone Encounter (Signed)
Additional Notes: Patient's daughter, Benetta Spar, calling in for triage. Triage limited due to she states she is not with her mother. Patient seen for virtual visit on 10/27/23 and prescribed doxycyline for bronchitis. Daughter states patient is about the same. Offered appointments available today with other PCP locations or urgent care and daughter prefers to be seen with [facility].  Called pt Daughter and set up appt with Toni Amend Grooms PA for 11/10/2023 @8 :40am

## 2023-11-09 NOTE — Telephone Encounter (Signed)
  Chief Complaint: bronchitis not improved after anitbiotic Symptoms: cough, wheezing, SOB with exertion, chest pain after coughing Frequency: since beginning of January Pertinent Negatives: Patient's daughter denies fever Disposition: [] ED /[] Urgent Care (no appt availability in office) / [x] Appointment(In office/virtual)/ []  [facility]/ [] Home Care/ [x] Refused Recommended Disposition /[] [facility]/ []  Follow-up with PCP Additional Notes: Patient's daughter, Benetta Spar, calling in for triage. Triage limited due to she states she is not with her mother. Patient seen for virtual visit on 10/27/23 and prescribed doxycyline for bronchitis. Daughter states patient is about the same. Offered appointments available today with other PCP locations or urgent care and daughter prefers to be seen with [facility].  Copied from CRM [id number]. Topic: Clinical - Red Word Triage >> Nov 09, 2023 10:42 AM Dondra Prader A wrote: Red Word that prompted transfer to Nurse Triage: [place] (pt daughter) states that pt is still having wheezing and chest hurting. Reason for Disposition  [1] MILD difficulty breathing (e.g., minimal/no SOB at rest, SOB with walking, pulse <100) AND [2] still present when not coughing  Answer Assessment - Initial Assessment Questions 1. ONSET: "When did the cough begin?"      Beginning of January 2025.  2. SEVERITY: "How bad is the cough today?"      Moderate to severe.  3. SPUTUM: "Describe the color of your sputum" (none, dry cough; clear, white, yellow, green)     Daughter is unsure if the cough is productive.  4. HEMOPTYSIS: "Are you coughing up any blood?" If so ask: "How much?" (flecks, streaks, tablespoons, etc.)     Denies.  5. DIFFICULTY BREATHING: "Are you having difficulty breathing?" If Yes, ask: "How bad is it?" (e.g., mild, moderate, severe)    - MILD: No SOB at rest, mild SOB with walking, speaks normally in sentences, can lie down, no retractions, pulse <  100.    - MODERATE: SOB at rest, SOB with minimal exertion and prefers to sit, cannot lie down flat, speaks in phrases, mild retractions, audible wheezing, pulse 100-120.    - SEVERE: Very SOB at rest, speaks in single words, struggling to breathe, sitting hunched forward, retractions, pulse > 120      SOB when walking around and exerting herself.  6. FEVER: "Do you have a fever?" If Yes, ask: "What is your temperature, how was it measured, and when did it start?"     Denies.  7. CARDIAC HISTORY: "Do you have any history of heart disease?" (e.g., heart attack, congestive heart failure)      Denies.  8. LUNG HISTORY: "Do you have any history of lung disease?"  (e.g., pulmonary embolus, asthma, emphysema)     Denies.  9. PE RISK FACTORS: "Do you have a history of blood clots?" (or: recent major surgery, recent prolonged travel, bedridden)     Denies.  10. OTHER SYMPTOMS: "Do you have any other symptoms?" (e.g., runny nose, wheezing, chest pain)       Wheezing, chest pain after coughing episodes throughout the day.  12. TRAVEL: "Have you traveled out of the country in the last month?" (e.g., travel history, exposures)       Denies.  Protocols used: Cough - Acute Non-Productive-A-AH

## 2023-11-10 ENCOUNTER — Ambulatory Visit (INDEPENDENT_AMBULATORY_CARE_PROVIDER_SITE_OTHER): Payer: Medicare Other | Admitting: Family Medicine

## 2023-11-10 ENCOUNTER — Ambulatory Visit: Payer: Medicare Other | Admitting: Physician Assistant

## 2023-11-10 VITALS — Ht 60.0 in

## 2023-11-10 DIAGNOSIS — J209 Acute bronchitis, unspecified: Secondary | ICD-10-CM

## 2023-11-10 MED ORDER — DICLOFENAC SODIUM 75 MG PO TBEC: 75.0000 mg | DELAYED_RELEASE_TABLET | Freq: Two times a day (BID) | ORAL | 0 refills | Status: DC | PRN

## 2023-11-10 MED ORDER — PREDNISONE 50 MG PO TABS: 50.0000 mg | ORAL_TABLET | Freq: Every day | ORAL | 0 refills | Status: AC

## 2023-11-10 NOTE — Patient Instructions (Signed)
Medications as directed.  Take care  Dr. Adriana Simas

## 2023-11-11 DIAGNOSIS — J209 Acute bronchitis, unspecified: Secondary | ICD-10-CM | POA: Insufficient documentation

## 2023-11-11 NOTE — Progress Notes (Signed)
Subjective:  Patient ID: Jamie Torres, female    DOB: 12/23/1955  Age: 68 y.o. MRN: [id number]  CC:   Chief Complaint  Patient presents with   Cough    Cough, medicine complete still coughing.     HPI:  68 year old female presents for evaluation of the above.  Patient recently seen virtually.  Placed on antibiotic therapy and cough medication by me.  She states that her symptoms have improved but she continues to have some cough.  Mildly troublesome.  No fever.  No shortness of breath.  No other associated symptoms.  No other complaints.  Patient Active Problem List   Diagnosis Date Noted   Acute bronchitis 11/11/2023   Prediabetes 10/01/2023   Chronic pain of left lower extremity 10/01/2023   Female genital prolapse 09/18/2022   Hepatic steatosis 09/17/2021   Essential hypertension 11/13/2020   Mixed hyperlipidemia 05/25/2013   Hypothyroidism 09/02/2010    Social Hx   Social History   Socioeconomic History   Marital status: Married    Spouse name: Not on file   Number of children: Not on file   Years of education: Not on file   Highest education level: Not on file  Occupational History   Not on file  Tobacco Use   Smoking status: Never   Smokeless tobacco: Never  Vaping Use   Vaping status: Never Used  Substance and Sexual Activity   Alcohol use: No   Drug use: No   Sexual activity: Not on file  Other Topics Concern   Not on file  Social History Narrative   Not on file   Social Drivers of Health   Financial Resource Strain: Not on file  Food Insecurity: Not on file  Transportation Needs: Not on file  Physical Activity: Not on file  Stress: Not on file  Social Connections: Not on file    Review of Systems Per HPI  Objective:  Ht 5' (1.524 m)   BMI 26.95 kg/m      10/16/2023   10:12 AM 10/01/2023    8:37 AM 04/01/2023    9:31 AM  BP/Weight  Systolic BP 113 128 112  Diastolic BP 74 79 75  Wt. (Lbs)  138 136  BMI  26.95 kg/m2 26.56 kg/m2     Physical Exam Vitals and nursing note reviewed.  Constitutional:      General: She is not in acute distress.    Appearance: Normal appearance.  HENT:     Head: Normocephalic and atraumatic.  Eyes:     General:        Right eye: No discharge.        Left eye: No discharge.     Conjunctiva/sclera: Conjunctivae normal.  Cardiovascular:     Rate and Rhythm: Normal rate and regular rhythm.  Pulmonary:     Effort: Pulmonary effort is normal.     Breath sounds: Normal breath sounds. No wheezing, rhonchi or rales.  Neurological:     Mental Status: She is alert.  Psychiatric:        Mood and Affect: Mood normal.        Behavior: Behavior normal.     Lab Results  Component Value Date   WBC 6.6 02/26/2023   HGB 13.5 02/26/2023   HCT 44.2 02/26/2023   PLT 272 02/26/2023   GLUCOSE 113 (H) 04/03/2023   CHOL 96 (L) 04/03/2023   TRIG 104 04/03/2023   HDL 34 (L) 04/03/2023   LDLCALC 42 04/03/2023  ALT 19 04/03/2023   AST 21 04/03/2023   NA 138 04/03/2023   K 4.4 04/03/2023   CL 103 04/03/2023   CREATININE 0.68 04/03/2023   BUN 16 04/03/2023   CO2 21 04/03/2023   TSH 6.750 (H) 10/01/2023   HGBA1C 6.6 (H) 10/01/2023     Assessment & Plan:   Problem List Items Addressed This Visit       Respiratory   Acute bronchitis - Primary   Cough is improved but still somewhat problematic.  Prednisone as directed.       Meds ordered this encounter  Medications   diclofenac (VOLTAREN) 75 MG EC tablet    Sig: Take 1 tablet (75 mg total) by mouth 2 (two) times daily as needed for moderate pain (pain score 4-6).    Dispense:  60 tablet    Refill:  0   predniSONE (DELTASONE) 50 MG tablet    Sig: Take 1 tablet (50 mg total) by mouth daily for 5 days.    Dispense:  5 tablet    Refill:  0    Follow-up:  Return if symptoms worsen or fail to improve.  Everlene Other DO [facility]

## 2023-11-11 NOTE — Assessment & Plan Note (Signed)
Cough is improved but still somewhat problematic.  Prednisone as directed.

## 2023-11-17 ENCOUNTER — Other Ambulatory Visit: Payer: Self-pay | Admitting: Family Medicine

## 2023-12-04 DIAGNOSIS — N8182 Incompetence or weakening of pubocervical tissue: Secondary | ICD-10-CM | POA: Diagnosis not present

## 2023-12-04 DIAGNOSIS — N8110 Cystocele, unspecified: Secondary | ICD-10-CM | POA: Diagnosis not present

## 2023-12-20 ENCOUNTER — Other Ambulatory Visit: Payer: Self-pay | Admitting: Family Medicine

## 2024-01-11 ENCOUNTER — Other Ambulatory Visit: Payer: Self-pay

## 2024-01-11 ENCOUNTER — Encounter (HOSPITAL_COMMUNITY): Payer: Self-pay | Admitting: Obstetrics and Gynecology

## 2024-01-11 NOTE — Progress Notes (Signed)
 SDW CALL  Using [employer], Patient was given pre-op instructions over the phone. The opportunity was given for the patient to ask questions. No further questions asked. Patient verbalized understanding of instructions given.   PCP - Dorie Rank Cook,DO Cardiologist - denies  PPM/ICD - denies Device Orders -  Rep Notified -   Chest x-ray -  EKG - 02/26/23 Stress Test - denies ECHO - denies Cardiac Cath -   Sleep Study - denies CPAP -   Fasting Blood Sugar - na Checks Blood Sugar _____ times a day  Blood Thinner Instructions:na Aspirin Instructions:na  ERAS Protcol -no PRE-SURGERY Ensure or G2-   COVID TEST- na   Anesthesia review: no  Patient denies shortness of breath, fever, cough and chest pain over the phone call    Surgical Instructions    Your procedure is scheduled on April 1  Report to [facility] Main Entrance "A" at 0530 A.M., then check in with the Admitting office.  Call this number if you have problems the morning of surgery:  [phone]    Remember:  Do not eat or drink after midnight the night before your surgery   Take these medicines the morning of surgery with A SIP OF WATER: Synthroid,Rosuvastatin  As of today, STOP taking any Aspirin (unless otherwise instructed by your surgeon) DICLOFENAC(VOLTAREN),Aleve, Naproxen, Ibuprofen, Motrin, Advil, Goody's, BC's, all herbal medications,OMEGA 3 FATTY ACIDS fish oil, and all vitamins.  [facility] is not responsible for any belongings or valuables. .   Do NOT Smoke (Tobacco/Vaping)  24 hours prior to your procedure  If you use a CPAP at night, you may bring your mask for your overnight stay.   Contacts, glasses, hearing aids, dentures or partials may not be worn into surgery, please bring cases for these belongings   Patients discharged the day of surgery will not be allowed to drive home, and someone needs to stay with them for 24 hours.     Special instructions:    Oral  Hygiene is also important to reduce your risk of infection.  Remember - BRUSH YOUR TEETH THE MORNING OF SURGERY WITH YOUR REGULAR TOOTHPASTE   Day of Surgery:  Take a shower the day of or night before with antibacterial soap. Wear Clean/Comfortable clothing the morning of surgery Do not apply any deodorants/lotions.   Do not wear jewelry or makeup Do not wear lotions, powders, perfumes/colognes, or deodorant. Do not shave 48 hours prior to surgery.  Men may shave face and neck. Do not bring valuables to the hospital. Do not wear nail polish, gel polish, artificial nails, or any other type of covering on natural nails (fingers and toes) If you have artificial nails or gel coating that need to be removed by a nail salon, please have this removed prior to surgery. Artificial nails or gel coating may interfere with anesthesia's ability to adequately monitor your vital signs. Remember to brush your teeth WITH YOUR REGULAR TOOTHPASTE.

## 2024-01-11 NOTE — H&P (Signed)
 Jamie Torres presents for worsening pressure and pain due to uterine and pelvic prolapse.  She desires surgical treatment and present for this. Symptomatic uterine prolapse with cystocele, rectocele and urinary incontinence wants surgical treatment Patient's Care Team Primary Care Provider: [facility]: [address] Jamie Torres, [place], [place] [address], Ph [phone], Fax [phone] NPI: [id number] Patient's Pharmacies [id number]): [address], [place],  [address], Ph [phone], Fax [phone] Vitals Wt: 139.4 lbs 12/04/2023 09:10 am Ht: 4 ft 9 in 12/04/2023 09:10 am BMI: 30.2 12/04/2023 09:10 am BP: 124/72 12/04/2023 09:15 am Allergies NKDA Medications Reviewed Medications amLODIPine 5 mg tablet 10/10/23   filled surescripts diclofenac sodium 75 mg tablet,delayed release 11/10/23   filled surescripts levothyroxine 75 mcg tablet 07/10/23   filled surescripts pantoprazole 40 mg tablet,delayed release TAKE 1 TABLET BY MOUTH 30 MINUTES BEFORE A MEAL ON AN EMPTY STOMACH. DO NOT TAKE WITH SYNTHROID 10/01/23   filled surescripts rosuvastatin 10 mg tablet TAKE 1 TABLET BY MOUTH DAILY 11/18/23   filled surescripts Problems Reviewed Problems Steatosis of liver - Onset: 09/29/2022 Family History Reviewed Family History Social History Social History not reviewed (last reviewed 07/29/2023) Marriage and Sexuality What is your relationship status?: Married Are you sexually active?: No Substance Use Do you or have you ever smoked tobacco?: Never smoker What was the date of your most recent tobacco screening?: 09/30/2022 What is your level of alcohol consumption?: None Do you use any illicit or recreational drugs?: No History of domestic violence: No Diet and Exercise What is your exercise level?: Occasional Education and Occupation Are you currently employed?: No (Notes: Retired) Gender Identity and LGBTQ Identity First name used:  Jamie Torres Sexual orientation: Straight or heterosexual Surgical History Surgical History not reviewed (last reviewed 07/29/2023) Vaginal delivery of fetus - 1975/1977/1986/1988. Ligation of bilateral fallopian tubes Procedure on eye - Cancerous mole removed. GYN History Reviewed GYN History Date of LMP: (Notes: PMP). Date of Last Pap Smear: 03/26/2023 (Notes: WNL/DUE IN 6 MONTHS WITH AEX.). History of Abnormal PAP: N. Date of Last Mammogram: (Notes: 05/2022/Jamie Torres.). Date of Last Colonoscopy: (Notes: 2022/WNL./Next colonoscopy in 10 years.). Sexually Active: N. History of Sexually Transmitted Infection: N. Current Birth Control Method:: Tubal Ligation. Obstetric History Reviewed Obstetric History TOTAL FULL PRE AB. I AB. S ECTOPICS MULTIPLE LIVING 4 4 0 0 0 0 0 4 Past Medical History Past Medical History not reviewed (last reviewed 09/30/2022) Cardiology- High Blood Pressure: Y Cardiology- High Cholesterol: Y Endocrinology- Hypothyroidism: Y Rheumatology- Arthritis: Y Screening None recorded. ROS Constitutional: Constitutional: no significant weight gain or loss and no fatigue or fever.  Skin: Skin: no rashes or abnormal moles.  Respiratory: Respiratory: no wheezing or dyspnea / shortness of breath.  Cardiovascular: Cardiovascular: no palpitations or chest pain.  Gastrointestinal: Gastrointestinal: no heartburn, dysphagia, nausea, vomiting, diarrhea, constipation, abdominal pain, bowel movement changes, or rectal bleeding.  Genitourinary: Genitourinary: no vaginal odor or itching and no hematuria, incontinence, rash, lesion, discharge, abnormal bleeding, flank pain, or trouble urinating.  Endocrine: Menstrual: no menstrual problems or PMDD symptoms. Menopausal: no menopausal symptoms. Sexual: no sexual problems.  Musculoskeletal: Musculoskeletal: no muscle aches or weakness and no arthralgias/joint pain or back pain.  Neurological: Neurologic: no headaches, dizziness,  LOC, weakness, or numbness.  Psychological: Psych: no depression, alcoholism, or sleep disturbances. Physical Exam Constitutional: *General Appearance: healthy-appearing, well-nourished, and well-developed.  Head: Head: normocephalic.  Neck: *Thyroid: non-tender and no enlargement.  Lymph Nodes: *Palpation: normal.  Cardiovascular: *Auscultation: RRR and  no murmur. *Peripheral Vascular: no varicosities or edema.  Lungs: *Auscultation: clear to auscultation. Inspection: normal and normal respiratory rate.  *Breast: Bilateral: no tenderness, skin changes, abnormal nipple secretions, or masses palpable and nipple appearance: normal. Right Breast: normal. Left Breast: normal.  Abdomen: *Inspection/Palpation/Auscultation: no tenderness, rebound, guarding, hepatomegaly, or splenomegaly and non-distended and soft. *Hernia: none palpated.  Back: Appearance normal. Palpation no costovertebral angle tenderness.  Female Genitalia: Vulva: no masses, atrophy, or lesions. Mons: no erythema, excoriation, atrophy, lesions, masses, swelling, tenderness, or vesicles/ ulcers and normal. Labia Majora: no erythema, excoriation, atrophy, discoloration, lesions, masses, swelling, tenderness, or vesicles/ ulcers and normal. Labia Minora: no erythema, excoriation, atrophy, discoloration, lesions, vesicles, masses, swelling, or tenderness and normal. Introitus: gaping. Bartholin's Gland: normal. *Vagina: no discharge, erythema, lesions, ulcers, swelling, tenderness, prolapse, or blood present and atrophy moderate, cystocele stage 3, and rectocele stage 3. *Cervix: no lesions, discharge, bleeding, or cervical motion tenderness. *Uterus: normal size and contour and midline, mobile, non-tender, and uterine prolapse stage 3. *Urethral Meatus/ Urethra: no discharge, masses, or tenderness and normal meatus and well supported urethra. *Bladder: non-distended, non-tender, and no palpable mass. *Adnexa/Parametria: no tenderness  or mass palpable.  Extremities: Legs: normal.  Skin: *Appearance: no rashes or lesions.  Psychiatric: *Orientation: to person, place, and time. *Mood and Affect: normal mood and affect and active and alert. Assessment / Plan 1. Uterine prolapse - the discussion and exam was done with her daughter as interpreter She also had questions and they were answered Discussed uterine prolapse. Options are expectant, pessary placement (discussed fitting and wearing), or surgical options.  we discussed surgery at length. Rec LAVH/BSO/A&P repair and SSLS we discussed the recovery and the pros and cons for SUI, offered urological eval. Wants to see how does after surgery. Would do Kelly plication and after recovery, if still symptomatic then urology.  Discussed the risks and benefits of the procedure including but not limited to infection, bleeding, damage to bowel, bladder, ureters, and risks associated with blood transfusion. We reviewed the procedure at length including the recovery. All here questions were answered and she gives informed consent. N81.4: Uterovaginal prolapse, unspecified  2. Cystocele and rectocele co-occurrent with incomplete uterovaginal prolapse - will schedule surgery and preop N81.2: Incomplete uterovaginal prolapse  We discussed the procedures at length.  The risks and benefits and alternative treatments were discussed.  Risks include, but not limited to, injury to bowel, bladder, uterus, tubes ovaries, risks associated with possible laparotomy, blood transfusion, and infection.  All of her questions were answered and she gives informed consent.  DL This patient has been seen and examined.   All of her questions were answered.  Labs and vital signs reviewed.  Informed consent has been obtained.  The History and Physical is current. Interpreted present DL

## 2024-01-12 ENCOUNTER — Observation Stay (HOSPITAL_COMMUNITY)
Admission: RE | Admit: 2024-01-12 | Discharge: 2024-01-13 | Disposition: A | Discharge status: Home / Self Care | Payer: Medicare Other | Attending: Obstetrics and Gynecology | Admitting: Obstetrics and Gynecology

## 2024-01-12 ENCOUNTER — Encounter (HOSPITAL_COMMUNITY): Payer: Self-pay | Admitting: Obstetrics and Gynecology

## 2024-01-12 ENCOUNTER — Observation Stay (HOSPITAL_BASED_OUTPATIENT_CLINIC_OR_DEPARTMENT_OTHER): Admitting: Anesthesiology

## 2024-01-12 ENCOUNTER — Observation Stay (HOSPITAL_COMMUNITY): Admitting: Anesthesiology

## 2024-01-12 ENCOUNTER — Other Ambulatory Visit: Payer: Self-pay

## 2024-01-12 ENCOUNTER — Encounter (HOSPITAL_COMMUNITY)
Admission: RE | Disposition: A | Discharge status: Home / Self Care | Payer: Self-pay | Source: Home / Self Care | Attending: Obstetrics and Gynecology

## 2024-01-12 DIAGNOSIS — N8110 Cystocele, unspecified: Secondary | ICD-10-CM | POA: Diagnosis not present

## 2024-01-12 DIAGNOSIS — N393 Stress incontinence (female) (male): Secondary | ICD-10-CM | POA: Insufficient documentation

## 2024-01-12 DIAGNOSIS — N816 Rectocele: Secondary | ICD-10-CM

## 2024-01-12 DIAGNOSIS — N814 Uterovaginal prolapse, unspecified: Secondary | ICD-10-CM | POA: Diagnosis present

## 2024-01-12 DIAGNOSIS — Z01818 Encounter for other preprocedural examination: Secondary | ICD-10-CM

## 2024-01-12 HISTORY — PX: LAPAROSCOPIC VAGINAL HYSTERECTOMY WITH SALPINGO OOPHORECTOMY: SHX6681

## 2024-01-12 HISTORY — PX: LAPAROSCOPIC LYSIS OF ADHESIONS: SHX5905

## 2024-01-12 HISTORY — PX: ANTERIOR AND POSTERIOR REPAIR WITH SACROSPINOUS FIXATION: SHX6536

## 2024-01-12 LAB — CBC
HCT: 41.5 % (ref 36.0–46.0)
Hemoglobin: 13.3 g/dL (ref 12.0–15.0)
MCH: 27.7 pg (ref 26.0–34.0)
MCHC: 32 g/dL (ref 30.0–36.0)
MCV: 86.3 fL (ref 80.0–100.0)
Platelets: 340 10*3/uL (ref 150–400)
RBC: 4.81 MIL/uL (ref 3.87–5.11)
RDW: 13.8 % (ref 11.5–15.5)
WBC: 11.5 10*3/uL — ABNORMAL HIGH (ref 4.0–10.5)
nRBC: 0 % (ref 0.0–0.2)

## 2024-01-12 LAB — BASIC METABOLIC PANEL WITH GFR
Anion gap: 11 (ref 5–15)
BUN: 11 mg/dL (ref 8–23)
CO2: 23 mmol/L (ref 22–32)
Calcium: 9.2 mg/dL (ref 8.9–10.3)
Chloride: 105 mmol/L (ref 98–111)
Creatinine, Ser: 0.58 mg/dL (ref 0.44–1.00)
GFR, Estimated: >60 mL/min (ref >60)
Glucose, Bld: 132 mg/dL — ABNORMAL HIGH (ref 70–99)
Potassium: 3.6 mmol/L (ref 3.5–5.1)
Sodium: 139 mmol/L (ref 135–145)

## 2024-01-12 LAB — ABO/RH: ABO/RH(D): O POS

## 2024-01-12 LAB — TYPE AND SCREEN
ABO/RH(D): O POS
Antibody Screen: NEGATIVE

## 2024-01-12 SURGERY — HYSTERECTOMY, VAGINAL, LAPAROSCOPY-ASSISTED, WITH SALPINGO-OOPHORECTOMY
Anesthesia: General | Site: Vagina

## 2024-01-12 MED ORDER — SODIUM CHLORIDE 0.9% FLUSH: 3.0000 mL | INTRAVENOUS | Status: DC | PRN

## 2024-01-12 MED ORDER — SODIUM CHLORIDE 0.9% FLUSH: 3.0000 mL | Freq: Two times a day (BID) | INTRAVENOUS | Status: DC

## 2024-01-12 MED ORDER — ALUM & MAG HYDROXIDE-SIMETH 200-200-20 MG/5ML PO SUSP: 30.0000 mL | ORAL | Status: DC | PRN

## 2024-01-12 MED ORDER — ESTRADIOL 0.1 MG/GM VA CREA
TOPICAL_CREAM | VAGINAL | Status: DC | PRN
  Administered 2024-01-12: 1 via VAGINAL

## 2024-01-12 MED ORDER — DEXAMETHASONE SODIUM PHOSPHATE 10 MG/ML IJ SOLN
INTRAMUSCULAR | Status: AC
  Filled 2024-01-12: qty 1

## 2024-01-12 MED ORDER — TRANEXAMIC ACID-NACL 1000-0.7 MG/100ML-% IV SOLN
INTRAVENOUS | Status: AC
  Filled 2024-01-12: qty 100

## 2024-01-12 MED ORDER — ONDANSETRON HCL 4 MG/2ML IJ SOLN
INTRAMUSCULAR | Status: DC | PRN
Start: 2024-01-12 — End: 2024-01-12
  Administered 2024-01-12: 4 mg via INTRAVENOUS

## 2024-01-12 MED ORDER — ROCURONIUM BROMIDE 10 MG/ML (PF) SYRINGE
PREFILLED_SYRINGE | INTRAVENOUS | Status: AC
  Filled 2024-01-12: qty 10

## 2024-01-12 MED ORDER — ORAL CARE MOUTH RINSE: 15.0000 mL | Freq: Once | OROMUCOSAL | Status: AC

## 2024-01-12 MED ORDER — LIDOCAINE 2% (20 MG/ML) 5 ML SYRINGE
INTRAMUSCULAR | Status: AC
  Filled 2024-01-12: qty 5

## 2024-01-12 MED ORDER — FENTANYL CITRATE (PF) 250 MCG/5ML IJ SOLN
INTRAMUSCULAR | Status: AC
  Filled 2024-01-12: qty 5

## 2024-01-12 MED ORDER — PROPOFOL 10 MG/ML IV BOLUS
INTRAVENOUS | Status: DC | PRN
  Administered 2024-01-12: 100 mg via INTRAVENOUS

## 2024-01-12 MED ORDER — TRANEXAMIC ACID-NACL 1000-0.7 MG/100ML-% IV SOLN
INTRAVENOUS | Status: DC | PRN
  Administered 2024-01-12: 1000 mg via INTRAVENOUS

## 2024-01-12 MED ORDER — EPHEDRINE SULFATE-NACL 50-0.9 MG/10ML-% IV SOSY
PREFILLED_SYRINGE | INTRAVENOUS | Status: DC | PRN
Start: 2024-01-12 — End: 2024-01-12
  Administered 2024-01-12: 5 mg via INTRAVENOUS

## 2024-01-12 MED ORDER — SODIUM CHLORIDE 0.9 % IV SOLN
2.0000 g | INTRAVENOUS | Status: AC
  Administered 2024-01-12: 2 g via INTRAVENOUS
  Filled 2024-01-12: qty 2

## 2024-01-12 MED ORDER — ONDANSETRON HCL 4 MG/2ML IJ SOLN: 4.0000 mg | Freq: Once | INTRAMUSCULAR | Status: DC | PRN

## 2024-01-12 MED ORDER — ACETAMINOPHEN 500 MG PO TABS
1000.0000 mg | ORAL_TABLET | Freq: Once | ORAL | Status: AC
  Administered 2024-01-12: 1000 mg via ORAL
  Filled 2024-01-12: qty 2

## 2024-01-12 MED ORDER — ZOLPIDEM TARTRATE 5 MG PO TABS: 5.0000 mg | ORAL_TABLET | Freq: Every evening | ORAL | Status: DC | PRN

## 2024-01-12 MED ORDER — LACTATED RINGERS IV SOLN: INTRAVENOUS | Status: DC

## 2024-01-12 MED ORDER — KETOROLAC TROMETHAMINE 30 MG/ML IJ SOLN
INTRAMUSCULAR | Status: AC
  Filled 2024-01-12: qty 1

## 2024-01-12 MED ORDER — FENTANYL CITRATE (PF) 250 MCG/5ML IJ SOLN
INTRAMUSCULAR | Status: DC | PRN
  Administered 2024-01-12: 100 ug via INTRAVENOUS
  Administered 2024-01-12: 50 ug via INTRAVENOUS
  Administered 2024-01-12: 100 ug via INTRAVENOUS

## 2024-01-12 MED ORDER — IBUPROFEN 600 MG PO TABS
600.0000 mg | ORAL_TABLET | Freq: Four times a day (QID) | ORAL | Status: DC | PRN
  Administered 2024-01-12 – 2024-01-13 (×2): 600 mg via ORAL
  Filled 2024-01-12 (×2): qty 1

## 2024-01-12 MED ORDER — DEXAMETHASONE SODIUM PHOSPHATE 10 MG/ML IJ SOLN
INTRAMUSCULAR | Status: DC | PRN
  Administered 2024-01-12: 10 mg via INTRAVENOUS

## 2024-01-12 MED ORDER — BUPIVACAINE HCL (PF) 0.25 % IJ SOLN
INTRAMUSCULAR | Status: DC | PRN
  Administered 2024-01-12: 10 mL

## 2024-01-12 MED ORDER — ACETAMINOPHEN 325 MG PO TABS
650.0000 mg | ORAL_TABLET | Freq: Four times a day (QID) | ORAL | Status: DC | PRN
  Administered 2024-01-12 – 2024-01-13 (×3): 650 mg via ORAL
  Filled 2024-01-12 (×3): qty 2

## 2024-01-12 MED ORDER — MIDAZOLAM HCL 2 MG/2ML IJ SOLN
INTRAMUSCULAR | Status: DC | PRN
  Administered 2024-01-12: 2 mg via INTRAVENOUS

## 2024-01-12 MED ORDER — PHENYLEPHRINE 80 MCG/ML (10ML) SYRINGE FOR IV PUSH (FOR BLOOD PRESSURE SUPPORT)
PREFILLED_SYRINGE | INTRAVENOUS | Status: AC
  Filled 2024-01-12: qty 20

## 2024-01-12 MED ORDER — HEMOSTATIC AGENTS (NO CHARGE) OPTIME
TOPICAL | Status: DC | PRN
  Administered 2024-01-12: 1 via TOPICAL

## 2024-01-12 MED ORDER — CHLORHEXIDINE GLUCONATE 0.12 % MT SOLN
15.0000 mL | Freq: Once | OROMUCOSAL | Status: AC
  Administered 2024-01-12: 15 mL via OROMUCOSAL
  Filled 2024-01-12: qty 15

## 2024-01-12 MED ORDER — FENTANYL CITRATE (PF) 100 MCG/2ML IJ SOLN: 25.0000 ug | INTRAMUSCULAR | Status: DC | PRN

## 2024-01-12 MED ORDER — ONDANSETRON HCL 4 MG/2ML IJ SOLN
INTRAMUSCULAR | Status: AC
  Filled 2024-01-12: qty 2

## 2024-01-12 MED ORDER — OXYCODONE HCL 5 MG PO TABS
5.0000 mg | ORAL_TABLET | ORAL | Status: DC | PRN
  Administered 2024-01-12: 5 mg via ORAL
  Filled 2024-01-12: qty 1

## 2024-01-12 MED ORDER — MIDAZOLAM HCL 2 MG/2ML IJ SOLN
INTRAMUSCULAR | Status: AC
  Filled 2024-01-12: qty 2

## 2024-01-12 MED ORDER — SUGAMMADEX SODIUM 200 MG/2ML IV SOLN
INTRAVENOUS | Status: DC | PRN
  Administered 2024-01-12: 120 mg via INTRAVENOUS

## 2024-01-12 MED ORDER — LIDOCAINE 2% (20 MG/ML) 5 ML SYRINGE
INTRAMUSCULAR | Status: DC | PRN
  Administered 2024-01-12: 40 mg via INTRAVENOUS

## 2024-01-12 MED ORDER — POVIDONE-IODINE 10 % EX SWAB
2.0000 | Freq: Once | CUTANEOUS | Status: AC
  Administered 2024-01-12: 2 via TOPICAL

## 2024-01-12 MED ORDER — PROPOFOL 10 MG/ML IV BOLUS
INTRAVENOUS | Status: AC
  Filled 2024-01-12: qty 20

## 2024-01-12 MED ORDER — MENTHOL 3 MG MT LOZG: 1.0000 | LOZENGE | OROMUCOSAL | Status: DC | PRN

## 2024-01-12 MED ORDER — KETOROLAC TROMETHAMINE 30 MG/ML IJ SOLN
INTRAMUSCULAR | Status: DC | PRN
  Administered 2024-01-12: 30 mg via INTRAVENOUS

## 2024-01-12 MED ORDER — PANTOPRAZOLE SODIUM 40 MG PO TBEC
40.0000 mg | DELAYED_RELEASE_TABLET | Freq: Every day | ORAL | Status: DC
  Administered 2024-01-12: 40 mg via ORAL
  Filled 2024-01-12 (×2): qty 1

## 2024-01-12 MED ORDER — ROCURONIUM BROMIDE 10 MG/ML (PF) SYRINGE
PREFILLED_SYRINGE | INTRAVENOUS | Status: DC | PRN
  Administered 2024-01-12: 40 mg via INTRAVENOUS

## 2024-01-12 MED ORDER — PHENYLEPHRINE HCL-NACL 20-0.9 MG/250ML-% IV SOLN
INTRAVENOUS | Status: DC | PRN
  Administered 2024-01-12: 25 ug/min via INTRAVENOUS

## 2024-01-12 MED ORDER — SIMETHICONE 80 MG PO CHEW: 80.0000 mg | CHEWABLE_TABLET | Freq: Four times a day (QID) | ORAL | Status: DC | PRN

## 2024-01-12 MED ORDER — 0.9 % SODIUM CHLORIDE (POUR BTL) OPTIME
TOPICAL | Status: DC | PRN
  Administered 2024-01-12: 1000 mL

## 2024-01-12 MED ORDER — BUPIVACAINE HCL (PF) 0.25 % IJ SOLN
INTRAMUSCULAR | Status: AC
  Filled 2024-01-12: qty 30

## 2024-01-12 MED ORDER — PHENYLEPHRINE 80 MCG/ML (10ML) SYRINGE FOR IV PUSH (FOR BLOOD PRESSURE SUPPORT)
PREFILLED_SYRINGE | INTRAVENOUS | Status: DC | PRN
Start: 2024-01-12 — End: 2024-01-12
  Administered 2024-01-12: 160 ug via INTRAVENOUS

## 2024-01-12 MED ORDER — EPHEDRINE 5 MG/ML INJ
INTRAVENOUS | Status: AC
  Filled 2024-01-12: qty 5

## 2024-01-12 SURGICAL SUPPLY — 65 items
APPLICATOR ARISTA FLEXITIP XL (MISCELLANEOUS) IMPLANT
BLADE SURG 15 STRL LF DISP TIS (BLADE) ×4 IMPLANT
BNDG COHESIVE 1X5 TAN STRL LF (GAUZE/BANDAGES/DRESSINGS) IMPLANT
CABLE HIGH FREQUENCY MONO STRZ (ELECTRODE) IMPLANT
CATH ROBINSON RED A/P 16FR (CATHETERS) ×4 IMPLANT
CONT PATH 16OZ SNAP LID 3702 (MISCELLANEOUS) IMPLANT
COVER BACK TABLE 60X90IN (DRAPES) ×4 IMPLANT
COVER MAYO STAND STRL (DRAPES) ×4 IMPLANT
DEVICE CAPIO SLIM BOX (INSTRUMENTS) IMPLANT
DRAPE SURG IRRIG POUCH 19X23 (DRAPES) ×4 IMPLANT
DRSG OPSITE POSTOP 3X4 (GAUZE/BANDAGES/DRESSINGS) IMPLANT
DURAPREP 26ML APPLICATOR (WOUND CARE) ×4 IMPLANT
ELECT REM PT RETURN 9FT ADLT (ELECTROSURGICAL) ×3 IMPLANT
ELECTRODE REM PT RTRN 9FT ADLT (ELECTROSURGICAL) ×4 IMPLANT
FORCEPS CUTTING 45CM 5MM (CUTTING FORCEPS) ×4 IMPLANT
GAUZE PACKING 1/2INX5YD STRL (GAUZE/BANDAGES/DRESSINGS) IMPLANT
GAUZE STRIP PACKING 2INX5YD (MISCELLANEOUS) ×4 IMPLANT
GLOVE BIO SURGEON STRL SZ8 (GLOVE) ×4 IMPLANT
GLOVE BIOGEL PI IND STRL 6.5 (GLOVE) ×4 IMPLANT
GLOVE BIOGEL PI IND STRL 7.0 (GLOVE) ×4 IMPLANT
GLOVE SURG ORTHO 8.0 STRL STRW (GLOVE) ×12 IMPLANT
GLOVE SURG UNDER POLY LF SZ7 (GLOVE) ×12 IMPLANT
GOWN STRL REUS W/ TWL LRG LVL3 (GOWN DISPOSABLE) ×16 IMPLANT
GOWN STRL REUS W/ TWL XL LVL3 (GOWN DISPOSABLE) IMPLANT
HEMOSTAT ARISTA ABSORB 3G PWDR (HEMOSTASIS) IMPLANT
IRRIG SUCT STRYKERFLOW 2 WTIP (MISCELLANEOUS) IMPLANT
IRRIGATION SUCT STRKRFLW 2 WTP (MISCELLANEOUS) IMPLANT
KIT PINK PAD W/HEAD ARE REST (MISCELLANEOUS) ×3 IMPLANT
KIT PINK PAD W/HEAD ARM REST (MISCELLANEOUS) ×4 IMPLANT
KIT TURNOVER KIT B (KITS) ×4 IMPLANT
LEGGING LITHOTOMY PAIR STRL (DRAPES) ×4 IMPLANT
LIGASURE IMPACT 36 18CM CVD LR (INSTRUMENTS) IMPLANT
NDL HYPO 22X1.5 SAFETY MO (MISCELLANEOUS) IMPLANT
NDL INSUFFLATION 14GA 120MM (NEEDLE) ×4 IMPLANT
NDL MAYO 6 CRC TAPER PT (NEEDLE) ×4 IMPLANT
NDL SPNL 22GX3.5 QUINCKE BK (NEEDLE) IMPLANT
NEEDLE HYPO 22X1.5 SAFETY MO (MISCELLANEOUS) IMPLANT
NEEDLE INSUFFLATION 14GA 120MM (NEEDLE) ×3 IMPLANT
NEEDLE MAYO 6 CRC TAPER PT (NEEDLE) ×3 IMPLANT
NEEDLE SPNL 22GX3.5 QUINCKE BK (NEEDLE) IMPLANT
NS IRRIG 1000ML POUR BTL (IV SOLUTION) ×4 IMPLANT
PACK LAVH (CUSTOM PROCEDURE TRAY) ×4 IMPLANT
PACK ROBOTIC GOWN (GOWN DISPOSABLE) ×4 IMPLANT
PACK VAGINAL WOMENS (CUSTOM PROCEDURE TRAY) ×4 IMPLANT
SET TUBE SMOKE EVAC HIGH FLOW (TUBING) ×4 IMPLANT
SLEEVE Z-THREAD 5X100MM (TROCAR) IMPLANT
SOL ELECTROSURG ANTI STICK (MISCELLANEOUS) IMPLANT
SOLUTION ELECTROSURG ANTI STCK (MISCELLANEOUS) IMPLANT
SPECIMEN JAR MEDIUM (MISCELLANEOUS) ×4 IMPLANT
SPIKE FLUID TRANSFER (MISCELLANEOUS) IMPLANT
SUT ABS MONO DBL WITH NDL 48IN (SUTURE) IMPLANT
SUT MNCRL 0 MO-4 VIOLET 18 CR (SUTURE) ×8 IMPLANT
SUT MNCRL 0 VIOLET 6X18 (SUTURE) ×4 IMPLANT
SUT MNCRL AB 0 CT1 27 (SUTURE) IMPLANT
SUT MON AB 2-0 CT1 27 (SUTURE) ×8 IMPLANT
SUT MON AB 2-0 CT1 36 (SUTURE) IMPLANT
SUT PDS AB 0 CT1 27 (SUTURE) IMPLANT
SUT VICRYL 0 UR6 27IN ABS (SUTURE) ×4 IMPLANT
SUT VICRYL RAPIDE 3 0 (SUTURE) ×4 IMPLANT
TOWEL GREEN STERILE FF (TOWEL DISPOSABLE) ×8 IMPLANT
TRAY FOLEY W/BAG SLVR 14FR (SET/KITS/TRAYS/PACK) ×4 IMPLANT
TROCAR OPTI TIP 5M 100M (ENDOMECHANICALS) ×4 IMPLANT
TROCAR XCEL DIL TIP R 11M (ENDOMECHANICALS) ×4 IMPLANT
UNDERPAD 30X36 HEAVY ABSORB (UNDERPADS AND DIAPERS) ×4 IMPLANT
WARMER LAPAROSCOPE (MISCELLANEOUS) ×4 IMPLANT

## 2024-01-12 NOTE — Transfer of Care (Signed)
 Immediate Anesthesia Transfer of Care Note  Patient: SHEANA BIR  Procedure(s) Performed: LAPAROSCOPIC ASSISTED VAGINAL HYSTERECTOMY WITH BILATERAL SALPINGO-OOPHORECTOMY (Bilateral: Abdomen) ANTERIOR AND POSTERIOR REPAIR WITH SACROSPINOUS FIXATION (Vagina )  Patient Location: PACU  Anesthesia Type:General  Level of Consciousness: awake  Airway & Oxygen Therapy: Patient Spontanous Breathing and Patient connected to face mask oxygen  Post-op Assessment: Report given to RN and Post -op Vital signs reviewed and stable  Post vital signs: Reviewed and stable  Last Vitals:  Vitals Value Taken Time  BP 95/53 01/12/24 1024  Temp    Pulse 75 01/12/24 1027  Resp 16 01/12/24 1027  SpO2 100 % 01/12/24 1027  Vitals shown include unfiled device data.  Last Pain:  Vitals:   01/12/24 0631  TempSrc:   PainSc: 0-No pain         Complications: No notable events documented.

## 2024-01-12 NOTE — Anesthesia Preprocedure Evaluation (Signed)
 Anesthesia Evaluation  Patient identified by MRN, date of birth, ID band Patient awake    Reviewed: Allergy & Precautions, NPO status , Patient's Chart, lab work & pertinent test results  Airway Mallampati: II  TM Distance: >3 FB Neck ROM: Full    Dental  (+) Teeth Intact, Dental Advisory Given   Pulmonary neg pulmonary ROS   Pulmonary exam normal breath sounds clear to auscultation       Cardiovascular hypertension, Pt. on medications Normal cardiovascular exam Rhythm:Regular Rate:Normal     Neuro/Psych negative neurological ROS  negative psych ROS   GI/Hepatic negative GI ROS, Neg liver ROS,,,  Endo/Other  Hypothyroidism    Renal/GU negative Renal ROS     Musculoskeletal  (+) Arthritis ,    Abdominal   Peds  Hematology negative hematology ROS (+)   Anesthesia Other Findings Day of surgery medications reviewed with the patient.  Reproductive/Obstetrics                             Anesthesia Physical Anesthesia Plan  ASA: 2  Anesthesia Plan: General   Post-op Pain Management: Tylenol PO (pre-op)* and Toradol IV (intra-op)*   Induction: Intravenous  PONV Risk Score and Plan: 4 or greater and Midazolam, Dexamethasone and Ondansetron  Airway Management Planned: Oral ETT  Additional Equipment:   Intra-op Plan:   Post-operative Plan: Extubation in OR  Informed Consent: I have reviewed the patients History and Physical, chart, labs and discussed the procedure including the risks, benefits and alternatives for the proposed anesthesia with the patient or authorized representative who has indicated his/her understanding and acceptance.     Dental advisory given  Plan Discussed with: CRNA  Anesthesia Plan Comments:        Anesthesia Quick Evaluation

## 2024-01-12 NOTE — Anesthesia Procedure Notes (Signed)
 Procedure Name: Intubation Date/Time: 01/12/2024 7:44 AM  Performed by: April Holding, CRNAPre-anesthesia Checklist: Patient identified, Emergency Drugs available, Suction available and Patient being monitored Patient Re-evaluated:Patient Re-evaluated prior to induction Oxygen Delivery Method: Circle System Utilized Preoxygenation: Pre-oxygenation with 100% oxygen Induction Type: IV induction Ventilation: Mask ventilation without difficulty Laryngoscope Size: Miller and 2 Grade View: Grade II Tube type: Oral Tube size: 7.0 mm Number of attempts: 1 Airway Equipment and Method: Stylet and Bite block Placement Confirmation: ETT inserted through vocal cords under direct vision, positive ETCO2 and breath sounds checked- equal and bilateral Secured at: 21 cm Tube secured with: Tape Dental Injury: Teeth and Oropharynx as per pre-operative assessment

## 2024-01-12 NOTE — Progress Notes (Signed)
*   Day of Surgery * Procedure(s) (LRB): LAPAROSCOPIC ASSISTED VAGINAL HYSTERECTOMY WITH BILATERAL SALPINGO-OOPHORECTOMY (Bilateral) ANTERIOR AND POSTERIOR REPAIR WITH SACROSPINOUS FIXATION (N/A) LAPAROSCOPIC LYSIS OF ADHESIONS  Subjective: Patient reports tolerating PO and + flatus.    Objective: I have reviewed patient's vital signs, intake and output, medications, and surgical findings .  GI: soft, non-tender; bowel sounds normal; no masses,  no organomegaly Vaginal Bleeding: minimal and vaginal packing removed  Assessment: s/p Procedure(s): LAPAROSCOPIC ASSISTED VAGINAL HYSTERECTOMY WITH BILATERAL SALPINGO-OOPHORECTOMY (Bilateral) ANTERIOR AND POSTERIOR REPAIR WITH SACROSPINOUS FIXATION (N/A) LAPAROSCOPIC LYSIS OF ADHESIONS: stable and progressing well  Plan: Advance diet Encourage ambulation Advance to PO medication Will remove foley tomorrow and begin bladder training  LOS: 0 days    Turner Daniels, MD 01/12/2024, 6:33 PM

## 2024-01-13 ENCOUNTER — Encounter (HOSPITAL_COMMUNITY): Payer: Self-pay | Admitting: Obstetrics and Gynecology

## 2024-01-13 DIAGNOSIS — N814 Uterovaginal prolapse, unspecified: Secondary | ICD-10-CM | POA: Diagnosis not present

## 2024-01-13 DIAGNOSIS — N816 Rectocele: Secondary | ICD-10-CM | POA: Diagnosis not present

## 2024-01-13 DIAGNOSIS — N8110 Cystocele, unspecified: Secondary | ICD-10-CM | POA: Diagnosis not present

## 2024-01-13 LAB — CBC
HCT: 32.7 % — ABNORMAL LOW (ref 36.0–46.0)
Hemoglobin: 10.7 g/dL — ABNORMAL LOW (ref 12.0–15.0)
MCH: 27.6 pg (ref 26.0–34.0)
MCHC: 32.7 g/dL (ref 30.0–36.0)
MCV: 84.3 fL (ref 80.0–100.0)
Platelets: 283 10*3/uL (ref 150–400)
RBC: 3.88 MIL/uL (ref 3.87–5.11)
RDW: 13.4 % (ref 11.5–15.5)
WBC: 14.1 10*3/uL — ABNORMAL HIGH (ref 4.0–10.5)
nRBC: 0 % (ref 0.0–0.2)

## 2024-01-13 LAB — SURGICAL PATHOLOGY

## 2024-01-13 MED ORDER — IBUPROFEN 600 MG PO TABS: 600.0000 mg | ORAL_TABLET | Freq: Four times a day (QID) | ORAL | 0 refills | Status: DC | PRN

## 2024-01-13 NOTE — Progress Notes (Signed)
 Patient given AVS discharge instructions (post-op care, s/s to call MD, follow-up in 2 weeks, medications) with the Spanish interpreter Viria. Patient, spouse and daughter verbalize understanding.

## 2024-01-13 NOTE — Plan of Care (Signed)

## 2024-01-13 NOTE — Discharge Summary (Signed)
 Physician Discharge Summary  Patient ID: Jamie Torres MRN: [id number] DOB/AGE: [id number] y.o.  Admit date: 01/12/2024 Discharge date: 01/13/2024  Admission Diagnoses:Symptomatic Uterine prolapse, with cystocele, rectocele, and SUI  Discharge Diagnoses: Same Principal Problem:   Uterine prolapse   Discharged Condition: good  Hospital Course: Jamie Torres presented for surgical correction of symptomatic uterine prolapse.  Her surgery was uncomplicated and her recovery was excellent.  Packing was removed the day of surgery.  POD 1 foley removed and she was able to void normal amounts with very little PVR by [place].  Her pain was managed with tylenol and motrin and she was ambulating without difficulty, tolerating regular diet and desired d/c home  Consults: None  Significant Diagnostic Studies: labs: post op Hgb 10.7  Treatments: surgery: LAVH, BSO with LOA, A&P repair, kelley plication and SSLS  Discharge Exam: Blood pressure (!) 103/53, pulse 64, temperature 98.2 F (36.8 C), temperature source Oral, resp. rate 18, height 4\' 10"  (1.473 m), weight 62.6 kg, SpO2 96%. General appearance: alert, cooperative, appears stated age, and no distress GI: soft, non-tender; bowel sounds normal; no masses,  no organomegaly Incision/Wound:CD&I  Disposition: Discharge disposition: 01-Home or Self Care       Discharge Instructions     Call MD for:  difficulty breathing, headache or visual disturbances   Complete by: As directed    Call MD for:  persistant nausea and vomiting   Complete by: As directed    Call MD for:  redness, tenderness, or signs of infection (pain, swelling, redness, odor or green/yellow discharge around incision site)   Complete by: As directed    Call MD for:  severe uncontrolled pain   Complete by: As directed    Call MD for:  temperature >100.4   Complete by: As directed    Diet - low sodium heart healthy   Complete by: As directed    Driving Restrictions    Complete by: As directed    No driving for 2 weeks   Increase activity slowly   Complete by: As directed    Lifting restrictions   Complete by: As directed    No lifting anything greater than 10 pounds (if you have to ask, don't lift it)   Sexual Activity Restrictions   Complete by: As directed    Nothing in the vagina for 6 weeks      Allergies as of 01/13/2024   No Known Allergies      Medication List     TAKE these medications    amLODipine 5 MG tablet Commonly known as: NORVASC Take 1 tablet (5 mg total) by mouth daily. What changed: when to take this   diclofenac 75 MG EC tablet Commonly known as: VOLTAREN TAKE 1 TABLET(75 MG) BY MOUTH TWICE DAILY AS NEEDED FOR MODERATE PAIN What changed: See the new instructions.   ibuprofen 600 MG tablet Commonly known as: ADVIL Take 1 tablet (600 mg total) by mouth every 6 (six) hours as needed for mild pain (pain score 1-3) or moderate pain (pain score 4-6).   levothyroxine 75 MCG tablet Commonly known as: SYNTHROID Take 75 mcg by mouth daily before breakfast.   levothyroxine 88 MCG tablet Commonly known as: SYNTHROID Take 1 tablet (88 mcg total) by mouth daily.   metFORMIN 500 MG tablet Commonly known as: GLUCOPHAGE Take 1 tablet (500 mg total) by mouth 2 (two) times daily with a meal.   OMEGA 3 PO Take 1 capsule by mouth daily.   pantoprazole  40 MG tablet Commonly known as: PROTONIX Take 1 tablet (40 mg total) by mouth daily as needed. What changed: when to take this   rosuvastatin 10 MG tablet Commonly known as: CRESTOR TAKE 1 TABLET(10 MG) BY MOUTH DAILY         Signed: Turner Daniels 01/13/2024, 11:43 AM

## 2024-01-13 NOTE — Op Note (Signed)
 NAME: Jamie Torres, TINEO MEDICAL RECORD NO: [id number] ACCOUNT NO: [id number] DATE OF BIRTH: [birth year] FACILITY: [facility] LOCATION: [facility] PHYSICIAN: Dineen Kid. Rana Snare, MD  Operative Report   DATE OF PROCEDURE: 01/12/2024  PREOPERATIVE DIAGNOSIS: Uterine prolapse with cystocele, rectocele, and stress urinary incontinence.  POSTOPERATIVE DIAGNOSES: Uterine prolapse with cystocele, rectocele, and stress urinary incontinence; Pelvic adhesions with the omentum to the left pelvic sidewall and left ovarian tube.  PROCEDURE PERFORMED: Laparoscopic-assisted vaginal hysterectomy with bilateral salpingo-oophorectomy, lysis of adhesions, anterior and posterior colporrhaphy, and sacrospinous ligament suspension.  SURGEON: Dr. Candice Camp.  ASSISTANT: Dr. Nilda Simmer.  ANESTHESIA: General endotracheal.  INDICATIONS: The patient is a 68 year old Hispanic female with worsening pressure and pain due to uterine prolapse and pelvic prolapse to the point that she has had complete uterine prolapse externally from the vagina. She also has some degree of urine.  She declines pessary treatment and wants surgical treatment of this with the planned LAVH-BSO, A&P repair and SSLS. We discussed the risks and benefits of the procedure at length, which include, but are not limited to risk of infection, bleeding, damage to the uterus,  tubes, ovaries, bowel or bladder, possible laparotomy, blood transfusion, infection, also recurrence of the procedure, difficulty with the bladder as far as prolonged catheter placement or unable to fix the incontinence. She does wish to proceed with the  surgery and gives informed consent.  FINDINGS: Complete uterine prolapse with cystocele and rectocele. She also has adhesions of the omentum to the left sidewall and also left tube and ovary. She has normal-appearing appendix, normal-appearing liver. Uterus is small. Ovaries appear to be  small and normal.  DESCRIPTION OF PROCEDURE: After  adequate analgesia, the patient was placed in the dorsal lithotomy position. She was sterilely prepped and draped. The bladder was sterilely drained. A tenaculum was placed in the anterior lip of the cervix and an Acorn  tenaculum placed in the cervix. A 1 cm infraumbilical skin incision was made. A Veress needle was inserted. The abdomen was insufflated to dullness to percussion. An 11-mm trocar was inserted. The laparoscope was then inserted. The above findings were  noted. A 5-mm trocar was inserted at the left of the midline under direct visualization. After careful and systematic evaluation of the abdomen and pelvis, Jason Nest cutting forceps were used to evaluate the pelvic adhesions and sharply dissect them from  the pelvic sidewall and the left tubal ovarian complex. Care was taken to achieve good hemostasis but avoid underlying bowel or sidewall injury. The right tuboovarian complex was grasped and dissected along the right infundibulopelvic ligament was  carried out down across the round ligament. This was done similarly along the left side with good hemostasis achieved and care taken to avoid the underlying ureters. Jarvis cutting forceps were then used to ligate across the round ligaments bilaterally  down to the inferior portion of the broad ligament. The bladder was then elevated and dissected off the anterior surface of the cervix, creating a small bladder flap. The abdomen was then desufflated. Legs were repositioned. The weighted speculum was  placed in the vagina. Posterior colpotomy was performed. The cervix was then circumscribed with Bovie cautery. LigaSure instrument was used to ligate across the uterosacral ligaments bilaterally and across the cardinal ligaments and the bladder pillars  bilaterally. The anterior vaginal mucosa was dissected off the top of the cervix and the anterior peritoneum was entered sharply and the Deaver retractor was placed underneath the bladder. A LigaSure  instrument was then used to ligate  across the cardinal  ligaments bilaterally up to the uterine vasculature bilaterally and then across the inferior portion of the broad ligament bilaterally with good hemostasis achieved. The uterus, tubes, and ovaries were removed and passed off. The uterosacral ligaments  were identified bilaterally and sutures of 0 Monocryl suture and a figure-of-eight were used to ligate each one. The posterior peritoneum was then closed in a pursestring fashion using 0 Monocryl suture. The uterosacral ligaments were then plicated in  the midline using figure-of-eight 0 Monocryl suture, and the posterior vaginal mucosa was closed using figure-of-eights in a vertical fashion down to the posterior portion of the apex of the cuff with good approximation and good hemostasis noted. The  anterior vaginal mucosa was then grasped. Metzenbaum was used to undermine the midline of the vaginal mucosa, and the vaginal mucosa was then dissected laterally using Allis clamps along the way. This was done until approximately 1.5 cm from the urethral  meatus as the pubic fascicular cervical fascia was then dissected laterally. We were able to identify the urethral meatus and the urethra and approximately 2 cm from the meatus. A Kelly plication stitch was used using 0 Monocryl suture with good support  noted of the UV angle. The pubic fascicular fascia was then plicated in the midline using figure-of-eights of 0 Monocryl suture in two separate rows of plication, with good anterior support noted. The excess vaginal mucosa was then excised, and the  anterior vaginal mucosa was then closed from the running lock and layer of 2-0 Monocryl suture with good approximation and good hemostasis noted. The perineum was then grasped with two Allis clamps, and a small incision was made across the introitus,  freeing up the posterior vaginal mucosa. The midline of the posterior vaginal mucosa was undermined and then  dissected and reflected laterally of the perirectal fascia. After dissecting the perirectal fascia off of the posterior vaginal mucosa we were  able to bluntly dissect to the right sacrospinous ligament, and easily palpated the sacrospinous ligaments near the sacrospinous process. A Capio suture was used and clamped and passed through the sacrospinous ligament approximately 3 cm away from the  sacrospinous process with good support noted and minimal bleeding noted. The suture was then fixated to the apex of the cuff to the right side using a pulley suture through the posterior vaginal mucosa. The rectocele was then reduced using  figure-of-eight 0 Monocryl suture posteriorly with good support noted. Excess vaginal mucosa was then excised. The posterior vaginal mucosa we began closure with a 2-0 Monocryl suture in a running locking fashion until about midway to the introitus. The  sacrospinous ligament fixation suture was then tightened with excellent support noted of the apex of the vagina. The remaining portion of the vagina was then closed using the 2-0 Monocryl suture with good approximation and good hemostasis noted and  closure of the peritoneum in the usual fashion with good approximation and good support noted. Minimal bleeding was noted in the cuff. A Foley catheter was placed with clear return of yellow urine. Packing using estrogen cream was placed in the vagina.  The legs were repositioned. The abdomen was re-insufflated. Examination revealed good hemostasis. Several small bleeders on the back wall of the bladder. Arista was placed with good hemostasis noted. All pedicles appeared to be in great hemostasis and  well away from the ureters, which were able to be seen. Good peristalsis bilaterally. No obvious injury to bowel or bladder noted. The abdomen was then desufflated. The  trocars was removed. The infraumbilical skin incision was closed with 0 Vicryl  interrupted suture in the fascia and a 3-0  Vicryl Rapide subcuticular suture. The 5 mm site was closed with a 3-0 Vicryl Rapide subcuticular suture. The incisions were injected with 0.25% Marcaine. A total of 10 mL was used. The patient was stable and  transferred to the recovery room. Estimated blood loss 150 mL. The patient received 2 g of cefotetan preoperatively.    SUJ D: 01/12/2024 6:45:43 pm T: 01/13/2024 12:21:00 am  JOB: [id number]/ [id number]

## 2024-01-13 NOTE — Progress Notes (Signed)
 1 Day Post-Op Procedure(s) (LRB): LAPAROSCOPIC ASSISTED VAGINAL HYSTERECTOMY WITH BILATERAL SALPINGO-OOPHORECTOMY (Bilateral) ANTERIOR AND POSTERIOR REPAIR WITH SACROSPINOUS FIXATION (N/A) LAPAROSCOPIC LYSIS OF ADHESIONS  Subjective: Patient reports tolerating PO and no problems voiding.   Was able to void this am after removal of catheter Pain well managed with prn motrin  Objective: I have reviewed patient's vital signs, intake and output, and medications.  General: alert, cooperative, appears stated age, and no distress GI: soft, non-tender; bowel sounds normal; no masses,  no organomegaly Vaginal Bleeding: none  Assessment: s/p Procedure(s): LAPAROSCOPIC ASSISTED VAGINAL HYSTERECTOMY WITH BILATERAL SALPINGO-OOPHORECTOMY (Bilateral) ANTERIOR AND POSTERIOR REPAIR WITH SACROSPINOUS FIXATION (N/A) LAPAROSCOPIC LYSIS OF ADHESIONS: stable  Plan: Now on oral pain meds, tolerating regular diet Foley out and now assessing bladder function Will d/c later today with or without catheter depending on results today  LOS: 0 days    Turner Daniels, MD 01/13/2024, 10:29 AM

## 2024-01-13 NOTE — Progress Notes (Signed)
 Bladder scan after voiding 21ml, Dr. Rana Snare in patients room and notified of results.

## 2024-01-13 NOTE — Anesthesia Postprocedure Evaluation (Signed)
 Anesthesia Post Note  Patient: Jamie Torres  Procedure(s) Performed: LAPAROSCOPIC ASSISTED VAGINAL HYSTERECTOMY WITH BILATERAL SALPINGO-OOPHORECTOMY (Bilateral: Abdomen) ANTERIOR AND POSTERIOR REPAIR WITH SACROSPINOUS FIXATION (Vagina ) LAPAROSCOPIC LYSIS OF ADHESIONS (Abdomen)     Patient location during evaluation: PACU Anesthesia Type: General Level of consciousness: awake and alert Pain management: pain level controlled Vital Signs Assessment: post-procedure vital signs reviewed and stable Respiratory status: spontaneous breathing, nonlabored ventilation and respiratory function stable Cardiovascular status: blood pressure returned to baseline and stable Postop Assessment: no apparent nausea or vomiting Anesthetic complications: no   No notable events documented.  Last Vitals:    Last Pain:                 Collene Schlichter

## 2024-01-24 ENCOUNTER — Other Ambulatory Visit: Payer: Self-pay | Admitting: Family Medicine

## 2024-01-25 ENCOUNTER — Other Ambulatory Visit: Payer: Self-pay

## 2024-01-25 MED ORDER — DICLOFENAC SODIUM 75 MG PO TBEC: 150.0000 mg | DELAYED_RELEASE_TABLET | Freq: Every day | ORAL | 0 refills | Status: DC

## 2024-02-17 ENCOUNTER — Other Ambulatory Visit: Payer: Self-pay | Admitting: Family Medicine

## 2024-02-18 ENCOUNTER — Other Ambulatory Visit: Payer: Self-pay

## 2024-02-18 MED ORDER — ROSUVASTATIN CALCIUM 10 MG PO TABS: 10.0000 mg | ORAL_TABLET | Freq: Every day | ORAL | 3 refills | Status: AC

## 2024-02-19 ENCOUNTER — Other Ambulatory Visit: Payer: Self-pay | Admitting: Family Medicine

## 2024-03-03 DIAGNOSIS — H2513 Age-related nuclear cataract, bilateral: Secondary | ICD-10-CM | POA: Diagnosis not present

## 2024-03-18 DIAGNOSIS — H2513 Age-related nuclear cataract, bilateral: Secondary | ICD-10-CM | POA: Diagnosis not present

## 2024-03-18 DIAGNOSIS — E039 Hypothyroidism, unspecified: Secondary | ICD-10-CM | POA: Diagnosis not present

## 2024-03-18 DIAGNOSIS — Z961 Presence of intraocular lens: Secondary | ICD-10-CM | POA: Diagnosis not present

## 2024-03-18 DIAGNOSIS — H2512 Age-related nuclear cataract, left eye: Secondary | ICD-10-CM | POA: Diagnosis not present

## 2024-04-01 ENCOUNTER — Ambulatory Visit (INDEPENDENT_AMBULATORY_CARE_PROVIDER_SITE_OTHER): Payer: Medicare Other | Admitting: Family Medicine

## 2024-04-01 VITALS — BP 107/72 | HR 61 | Temp 98.4°F | Ht <= 58 in | Wt 135.6 lb

## 2024-04-01 DIAGNOSIS — E039 Hypothyroidism, unspecified: Secondary | ICD-10-CM | POA: Diagnosis not present

## 2024-04-01 DIAGNOSIS — D649 Anemia, unspecified: Secondary | ICD-10-CM | POA: Diagnosis not present

## 2024-04-01 DIAGNOSIS — Z7984 Long term (current) use of oral hypoglycemic drugs: Secondary | ICD-10-CM | POA: Diagnosis not present

## 2024-04-01 DIAGNOSIS — E119 Type 2 diabetes mellitus without complications: Secondary | ICD-10-CM | POA: Diagnosis not present

## 2024-04-01 DIAGNOSIS — E782 Mixed hyperlipidemia: Secondary | ICD-10-CM

## 2024-04-01 DIAGNOSIS — I10 Essential (primary) hypertension: Secondary | ICD-10-CM

## 2024-04-01 DIAGNOSIS — M79605 Pain in left leg: Secondary | ICD-10-CM | POA: Diagnosis not present

## 2024-04-01 MED ORDER — BACLOFEN 10 MG PO TABS: 5.0000 mg | ORAL_TABLET | Freq: Three times a day (TID) | ORAL | 0 refills | Status: AC | PRN

## 2024-04-01 NOTE — Patient Instructions (Signed)
Medication as prescribed.  Labs today.  Follow up in 6 months.  

## 2024-04-03 ENCOUNTER — Other Ambulatory Visit: Payer: Self-pay | Admitting: Family Medicine

## 2024-04-03 DIAGNOSIS — M79605 Pain in left leg: Secondary | ICD-10-CM | POA: Insufficient documentation

## 2024-04-03 MED ORDER — METFORMIN HCL 500 MG PO TABS: 500.0000 mg | ORAL_TABLET | Freq: Two times a day (BID) | ORAL | 3 refills | Status: AC

## 2024-04-03 NOTE — Assessment & Plan Note (Signed)
At goal.  Continue Crestor. 

## 2024-04-03 NOTE — Assessment & Plan Note (Signed)
 TSH to assess. Will dose adjust Levothyroxine .

## 2024-04-03 NOTE — Progress Notes (Signed)
 Subjective:  Patient ID: Jamie Torres, female    DOB: 06-Mar-1956  Age: 68 y.o. MRN: [id number]  CC:  Follow up  HPI:  68 year old female presents for follow up. Spanish interpreter present today.  Patient reports she continues to have leg pain (left). Anterior with associated numbness. Has had imaging. Mild degenerative changes of the lumbar spine.   BP stable.   Needs labs today.  Lipids at goal.    Patient Active Problem List   Diagnosis Date Noted   Left leg pain 04/03/2024   Type 2 diabetes mellitus without complications (HCC) 04/01/2024   Hepatic steatosis 09/17/2021   Essential hypertension 11/13/2020   Mixed hyperlipidemia 05/25/2013   Hypothyroidism 09/02/2010    Social Hx   Social History   Socioeconomic History   Marital status: Married    Spouse name: Not on file   Number of children: Not on file   Years of education: Not on file   Highest education level: Not on file  Occupational History   Not on file  Tobacco Use   Smoking status: Never   Smokeless tobacco: Never  Vaping Use   Vaping status: Never Used  Substance and Sexual Activity   Alcohol use: No   Drug use: No   Sexual activity: Not on file  Other Topics Concern   Not on file  Social History Narrative   Not on file   Social Drivers of Health   Financial Resource Strain: Not on file  Food Insecurity: No Food Insecurity (01/12/2024)   Hunger Vital Sign    Worried About Running Out of Food in the Last Year: Never true    Ran Out of Food in the Last Year: Never true  Transportation Needs: No Transportation Needs (01/12/2024)   PRAPARE - Administrator, Civil Service (Medical): No    Lack of Transportation (Non-Medical): No  Physical Activity: Not on file  Stress: Not on file  Social Connections: Socially Integrated (01/12/2024)   Social Connection and Isolation Panel    Frequency of Communication with Friends and Family: More than three times a week    Frequency of Social  Gatherings with Friends and Family: More than three times a week    Attends Religious Services: More than 4 times per year    Active Member of [employer] or Organizations: Yes    Attends Engineer, structural: More than 4 times per year    Marital Status: Married    Review of Systems Per HPI  Objective:  BP 107/72   Pulse 61   Temp 98.4 F (36.9 C)   Ht 4' 10 (1.473 m)   Wt 135 lb 9.6 oz (61.5 kg)   SpO2 99%   BMI 28.34 kg/m      04/01/2024    8:41 AM 01/13/2024    8:04 AM 01/13/2024    4:20 AM  BP/Weight  Systolic BP 107 103 99  Diastolic BP 72 53 50  Wt. (Lbs) 135.6    BMI 28.34 kg/m2      Physical Exam Vitals and nursing note reviewed.  Constitutional:      General: She is not in acute distress.    Appearance: Normal appearance.  HENT:     Head: Normocephalic and atraumatic.   Eyes:     General:        Right eye: No discharge.        Left eye: No discharge.     Conjunctiva/sclera: Conjunctivae  normal.    Cardiovascular:     Rate and Rhythm: Normal rate and regular rhythm.  Pulmonary:     Effort: Pulmonary effort is normal.     Breath sounds: Normal breath sounds. No wheezing, rhonchi or rales.   Neurological:     Mental Status: She is alert.   Psychiatric:        Mood and Affect: Mood normal.        Behavior: Behavior normal.     Lab Results  Component Value Date   WBC 14.1 (H) 01/13/2024   HGB 10.7 (L) 01/13/2024   HCT 32.7 (L) 01/13/2024   PLT 283 01/13/2024   GLUCOSE 132 (H) 01/12/2024   CHOL 96 (L) 04/03/2023   TRIG 104 04/03/2023   HDL 34 (L) 04/03/2023   LDLCALC 42 04/03/2023   ALT 19 04/03/2023   AST 21 04/03/2023   NA 139 01/12/2024   K 3.6 01/12/2024   CL 105 01/12/2024   CREATININE 0.58 01/12/2024   BUN 11 01/12/2024   CO2 23 01/12/2024   TSH 6.750 (H) 10/01/2023   HGBA1C 6.6 (H) 10/01/2023     Assessment & Plan:  Essential hypertension Assessment & Plan: BP well controlled. Continue Norvasc .   Type 2 diabetes  mellitus without complication, without long-term current use of insulin (HCC) Assessment & Plan: A1c to assess. Continue Metformin .   Orders: -     CMP14+EGFR -     Hemoglobin A1c -     Microalbumin / creatinine urine ratio  Mixed hyperlipidemia Assessment & Plan: At goal. Continue Crestor .   Orders: -     Lipid panel  Hypothyroidism, unspecified type Assessment & Plan: TSH to assess. Will dose adjust Levothyroxine .  Orders: -     TSH  Anemia, unspecified type -     CBC  Left leg pain Assessment & Plan: Baclofen  as directed.    Other orders -     Baclofen ; Take 0.5-1 tablets (5-10 mg total) by mouth 3 (three) times daily as needed (Pain in left thigh).  Dispense: 30 each; Refill: 0 -     metFORMIN  HCl; Take 1 tablet (500 mg total) by mouth 2 (two) times daily with a meal.  Dispense: 180 tablet; Refill: 3    Follow-up:  6 months  Claudett Bayly Bluford DO [facility]

## 2024-04-03 NOTE — Assessment & Plan Note (Signed)
Baclofen as directed.

## 2024-04-03 NOTE — Assessment & Plan Note (Signed)
 A1c to assess. Continue Metformin .

## 2024-04-03 NOTE — Assessment & Plan Note (Signed)
 BP well controlled. Continue Norvasc .

## 2024-04-04 DIAGNOSIS — D649 Anemia, unspecified: Secondary | ICD-10-CM | POA: Diagnosis not present

## 2024-04-04 DIAGNOSIS — E782 Mixed hyperlipidemia: Secondary | ICD-10-CM | POA: Diagnosis not present

## 2024-04-04 DIAGNOSIS — E119 Type 2 diabetes mellitus without complications: Secondary | ICD-10-CM | POA: Diagnosis not present

## 2024-04-04 DIAGNOSIS — E039 Hypothyroidism, unspecified: Secondary | ICD-10-CM | POA: Diagnosis not present

## 2024-04-05 LAB — MICROALBUMIN / CREATININE URINE RATIO
Creatinine, Urine: 93.4 mg/dL
Microalb/Creat Ratio: 20 mg/g{creat} (ref 0–29)
Microalbumin, Urine: 18.8 ug/mL

## 2024-04-05 LAB — HEMOGLOBIN A1C
Est. average glucose Bld gHb Est-mCnc: 134 mg/dL
Hgb A1c MFr Bld: 6.3 % — ABNORMAL HIGH (ref 4.8–5.6)

## 2024-04-05 LAB — LIPID PANEL
Chol/HDL Ratio: 3 ratio (ref 0.0–4.4)
Cholesterol, Total: 103 mg/dL (ref 100–199)
HDL: 34 mg/dL — ABNORMAL LOW (ref >39)
LDL Chol Calc (NIH): 49 mg/dL (ref 0–99)
Triglycerides: 104 mg/dL (ref 0–149)
VLDL Cholesterol Cal: 20 mg/dL (ref 5–40)

## 2024-04-05 LAB — CBC
Hematocrit: 39.8 % (ref 34.0–46.6)
Hemoglobin: 12 g/dL (ref 11.1–15.9)
MCH: 27 pg (ref 26.6–33.0)
MCHC: 30.2 g/dL — ABNORMAL LOW (ref 31.5–35.7)
MCV: 90 fL (ref 79–97)
Platelets: 335 10*3/uL (ref 150–450)
RBC: 4.44 x10E6/uL (ref 3.77–5.28)
RDW: 14 % (ref 11.7–15.4)
WBC: 8 10*3/uL (ref 3.4–10.8)

## 2024-04-05 LAB — CMP14+EGFR
ALT: 22 IU/L (ref 0–32)
AST: 19 IU/L (ref 0–40)
Albumin: 4.2 g/dL (ref 3.9–4.9)
Alkaline Phosphatase: 120 IU/L (ref 44–121)
BUN/Creatinine Ratio: 27 (ref 12–28)
BUN: 19 mg/dL (ref 8–27)
Bilirubin Total: <0.2 mg/dL (ref 0.0–1.2)
CO2: 19 mmol/L — ABNORMAL LOW (ref 20–29)
Calcium: 9 mg/dL (ref 8.7–10.3)
Chloride: 106 mmol/L (ref 96–106)
Creatinine, Ser: 0.71 mg/dL (ref 0.57–1.00)
Globulin, Total: 3.1 g/dL (ref 1.5–4.5)
Glucose: 109 mg/dL — ABNORMAL HIGH (ref 70–99)
Potassium: 4.1 mmol/L (ref 3.5–5.2)
Sodium: 140 mmol/L (ref 134–144)
Total Protein: 7.3 g/dL (ref 6.0–8.5)
eGFR: 92 mL/min/{1.73_m2} (ref >59)

## 2024-04-05 LAB — TSH: TSH: 3.89 u[IU]/mL (ref 0.450–4.500)

## 2024-04-06 ENCOUNTER — Ambulatory Visit: Payer: Self-pay | Admitting: Family Medicine

## 2024-04-08 DIAGNOSIS — E039 Hypothyroidism, unspecified: Secondary | ICD-10-CM | POA: Diagnosis not present

## 2024-04-08 DIAGNOSIS — H2513 Age-related nuclear cataract, bilateral: Secondary | ICD-10-CM | POA: Diagnosis not present

## 2024-04-08 DIAGNOSIS — H2511 Age-related nuclear cataract, right eye: Secondary | ICD-10-CM | POA: Diagnosis not present

## 2024-04-08 DIAGNOSIS — Z961 Presence of intraocular lens: Secondary | ICD-10-CM | POA: Diagnosis not present

## 2024-04-27 ENCOUNTER — Other Ambulatory Visit: Payer: Self-pay | Admitting: Family Medicine

## 2024-05-03 DIAGNOSIS — R309 Painful micturition, unspecified: Secondary | ICD-10-CM | POA: Diagnosis not present

## 2024-05-04 ENCOUNTER — Other Ambulatory Visit: Payer: Self-pay | Admitting: Family Medicine

## 2024-05-21 DIAGNOSIS — H43393 Other vitreous opacities, bilateral: Secondary | ICD-10-CM | POA: Diagnosis not present

## 2024-06-21 ENCOUNTER — Encounter (HOSPITAL_COMMUNITY): Payer: Self-pay

## 2024-06-22 ENCOUNTER — Encounter (HOSPITAL_COMMUNITY)

## 2024-06-22 DIAGNOSIS — Z1231 Encounter for screening mammogram for malignant neoplasm of breast: Secondary | ICD-10-CM

## 2024-07-25 ENCOUNTER — Other Ambulatory Visit (HOSPITAL_COMMUNITY): Payer: Self-pay | Admitting: Family Medicine

## 2024-07-25 DIAGNOSIS — Z1231 Encounter for screening mammogram for malignant neoplasm of breast: Secondary | ICD-10-CM

## 2024-07-26 ENCOUNTER — Other Ambulatory Visit: Payer: Self-pay | Admitting: Family Medicine

## 2024-08-03 ENCOUNTER — Ambulatory Visit (HOSPITAL_COMMUNITY)
Admission: RE | Admit: 2024-08-03 | Discharge: 2024-08-03 | Disposition: A | Discharge status: Home / Self Care | Source: Ambulatory Visit | Attending: Family Medicine | Admitting: Family Medicine

## 2024-08-03 ENCOUNTER — Encounter (HOSPITAL_COMMUNITY): Payer: Self-pay

## 2024-08-03 ENCOUNTER — Ambulatory Visit: Payer: Self-pay | Admitting: Family Medicine

## 2024-08-03 DIAGNOSIS — Z780 Asymptomatic menopausal state: Secondary | ICD-10-CM | POA: Diagnosis not present

## 2024-08-03 DIAGNOSIS — Z1231 Encounter for screening mammogram for malignant neoplasm of breast: Secondary | ICD-10-CM | POA: Diagnosis not present

## 2024-08-03 DIAGNOSIS — M810 Age-related osteoporosis without current pathological fracture: Secondary | ICD-10-CM | POA: Diagnosis not present

## 2024-08-04 NOTE — Telephone Encounter (Signed)
--  spoke with [place] , daughter and informed of bone density results. She will discuss with patient and call back if would like to proceed with bisphosphonate. Copied from CRM #[id number]. Topic: Clinical - Lab/Test Results >> Aug 04, 2024  1:26 PM Hadassah PARAS wrote: Reason for CRM: Pt's daughter is calling in to obtain results. Please advise [place]

## 2024-08-14 ENCOUNTER — Other Ambulatory Visit: Payer: Self-pay | Admitting: Family Medicine

## 2024-08-14 MED ORDER — ALENDRONATE SODIUM 70 MG PO TABS: 70.0000 mg | ORAL_TABLET | ORAL | 3 refills | Status: AC

## 2024-10-03 ENCOUNTER — Ambulatory Visit: Admitting: Family Medicine

## 2024-10-03 ENCOUNTER — Encounter: Payer: Self-pay | Admitting: Family Medicine

## 2024-10-03 VITALS — BP 133/81 | HR 66 | Temp 98.2°F | Ht <= 58 in | Wt 134.4 lb

## 2024-10-03 DIAGNOSIS — Z23 Encounter for immunization: Secondary | ICD-10-CM | POA: Diagnosis not present

## 2024-10-03 DIAGNOSIS — M7918 Myalgia, other site: Secondary | ICD-10-CM | POA: Diagnosis not present

## 2024-10-03 DIAGNOSIS — Z7984 Long term (current) use of oral hypoglycemic drugs: Secondary | ICD-10-CM | POA: Diagnosis not present

## 2024-10-03 DIAGNOSIS — I10 Essential (primary) hypertension: Secondary | ICD-10-CM | POA: Diagnosis not present

## 2024-10-03 DIAGNOSIS — E782 Mixed hyperlipidemia: Secondary | ICD-10-CM | POA: Diagnosis not present

## 2024-10-03 DIAGNOSIS — E119 Type 2 diabetes mellitus without complications: Secondary | ICD-10-CM | POA: Diagnosis not present

## 2024-10-03 DIAGNOSIS — E039 Hypothyroidism, unspecified: Secondary | ICD-10-CM | POA: Diagnosis not present

## 2024-10-03 DIAGNOSIS — L853 Xerosis cutis: Secondary | ICD-10-CM | POA: Insufficient documentation

## 2024-10-03 DIAGNOSIS — M810 Age-related osteoporosis without current pathological fracture: Secondary | ICD-10-CM | POA: Insufficient documentation

## 2024-10-03 MED ORDER — MELOXICAM 15 MG PO TABS: 15.0000 mg | ORAL_TABLET | Freq: Every day | ORAL | 3 refills | Status: AC | PRN

## 2024-10-03 NOTE — Progress Notes (Signed)
 "  Subjective:  Patient ID: Jamie Torres, female    DOB: 1956-03-29  Age: 68 y.o. MRN: [id number]  CC:   Chief Complaint  Patient presents with   Medical Management of Chronic Issues    6 mo f/u     HPI:  68 year old female follow-up.  Patient reports that she get her flu vaccine this December.  Amenable to pneumococcal vaccine today.  Can get her tetanus and shingles vaccine at the pharmacy.  Patient reports that she is experiencing dry skin and dry lips.  She states that this has been going on for the past couple of months.  Patient reports that she is having pain of her hands predominantly.  She states that the diclofenac  does not seem to be working.  BP stable on amlodipine .  Lipids at goal on Crestor .  A1c has been at goal.  Needs A1c.  Needs foot exam today.  Hypothyroidism stable.  Needs TSH.    Patient Active Problem List   Diagnosis Date Noted   Osteoporosis 10/03/2024   Musculoskeletal pain 10/03/2024   Dry skin 10/03/2024   Type 2 diabetes mellitus without complications (HCC) 04/01/2024   Hepatic steatosis 09/17/2021   Essential hypertension 11/13/2020   Mixed hyperlipidemia 05/25/2013   Hypothyroidism 09/02/2010    Social Hx   Social History   Socioeconomic History   Marital status: Married    Spouse name: Not on file   Number of children: Not on file   Years of education: Not on file   Highest education level: Not on file  Occupational History   Not on file  Tobacco Use   Smoking status: Never   Smokeless tobacco: Never  Vaping Use   Vaping status: Never Used  Substance and Sexual Activity   Alcohol use: No   Drug use: No   Sexual activity: Not on file  Other Topics Concern   Not on file  Social History Narrative   Not on file   Social Drivers of Health   Tobacco Use: Low Risk (10/03/2024)   Patient History    Smoking Tobacco Use: Never    Smokeless Tobacco Use: Never    Passive Exposure: Not on file  Financial Resource Strain:  Not on file  Food Insecurity: No Food Insecurity (01/12/2024)   Hunger Vital Sign    Worried About Running Out of Food in the Last Year: Never true    Ran Out of Food in the Last Year: Never true  Transportation Needs: No Transportation Needs (01/12/2024)   PRAPARE - Administrator, Civil Service (Medical): No    Lack of Transportation (Non-Medical): No  Physical Activity: Not on file  Stress: Not on file  Social Connections: Socially Integrated (01/12/2024)   Social Connection and Isolation Panel    Frequency of Communication with Friends and Family: More than three times a week    Frequency of Social Gatherings with Friends and Family: More than three times a week    Attends Religious Services: More than 4 times per year    Active Member of Clubs or Organizations: Yes    Attends Banker Meetings: More than 4 times per year    Marital Status: Married  Depression (PHQ2-9): Low Risk (10/03/2024)   Depression (PHQ2-9)    PHQ-2 Score: 0  Alcohol Screen: Not on file  Housing: Low Risk (01/12/2024)   Housing Stability Vital Sign    Unable to Pay for Housing in the Last Year: No  Number of Times Moved in the Last Year: 0    Homeless in the Last Year: No  Utilities: Not At Risk (01/12/2024)   [employer] Utilities    Threatened with loss of utilities: No  Health Literacy: Not on file    Review of Systems Per HPI  Objective:  BP 133/81   Pulse 66   Temp 98.2 F (36.8 C)   Ht 4' 10 (1.473 m)   Wt 134 lb 6.4 oz (61 kg)   SpO2 100%   BMI 28.09 kg/m      10/03/2024    8:43 AM 04/01/2024    8:41 AM 01/13/2024    8:04 AM  BP/Weight  Systolic BP 133 107 103  Diastolic BP 81 72 53  Wt. (Lbs) 134.4 135.6   BMI 28.09 kg/m2 28.34 kg/m2     Physical Exam Vitals and nursing note reviewed.  Constitutional:      General: She is not in acute distress.    Appearance: Normal appearance.  HENT:     Head: Normocephalic and atraumatic.  Eyes:     General:        Right  eye: No discharge.        Left eye: No discharge.     Conjunctiva/sclera: Conjunctivae normal.  Cardiovascular:     Rate and Rhythm: Normal rate and regular rhythm.  Pulmonary:     Effort: Pulmonary effort is normal.     Breath sounds: Normal breath sounds. No wheezing, rhonchi or rales.  Feet:     Comments: Diabetic foot exam performed today.  See quality metrics section. Neurological:     Mental Status: She is alert.  Psychiatric:        Mood and Affect: Mood normal.        Behavior: Behavior normal.     Lab Results  Component Value Date   WBC 8.0 04/04/2024   HGB 12.0 04/04/2024   HCT 39.8 04/04/2024   PLT 335 04/04/2024   GLUCOSE 109 (H) 04/04/2024   CHOL 103 04/04/2024   TRIG 104 04/04/2024   HDL 34 (L) 04/04/2024   LDLCALC 49 04/04/2024   ALT 22 04/04/2024   AST 19 04/04/2024   NA 140 04/04/2024   K 4.1 04/04/2024   CL 106 04/04/2024   CREATININE 0.71 04/04/2024   BUN 19 04/04/2024   CO2 19 (L) 04/04/2024   TSH 3.890 04/04/2024   HGBA1C 6.3 (H) 04/04/2024     Assessment & Plan:  Type 2 diabetes mellitus without complication, without long-term current use of insulin (HCC) Assessment & Plan: At goal.  Continue metformin .  A1c today.  Orders: -     Hemoglobin A1c  Hypothyroidism, unspecified type Assessment & Plan: TSH today.  Continue current dosing levothyroxine .  Will adjust if needed.  Orders: -     TSH  Essential hypertension Assessment & Plan: Stable.  Continue amlodipine .   Musculoskeletal pain Assessment & Plan: Changing diclofenac  to meloxicam .  Orders: -     Meloxicam ; Take 1 tablet (15 mg total) by mouth daily as needed for pain.  Dispense: 30 tablet; Refill: 3  Mixed hyperlipidemia Assessment & Plan: LDL at goal on Crestor .  Continue.   Dry skin Assessment & Plan: Advised to use CeraVe and Aquaphor.     Follow-up:  6 months  Sandrea Boer Bluford DO [facility] "

## 2024-10-03 NOTE — Assessment & Plan Note (Signed)
LDL at goal on Crestor.  Continue.

## 2024-10-03 NOTE — Assessment & Plan Note (Signed)
 At goal.  Continue metformin .  A1c today.

## 2024-10-03 NOTE — Patient Instructions (Addendum)
 Use CeraVe for Dry skin and Aquaphor for dry lips. Stay well hydrated.  Medication sent in for your pain.  Labs today.  Follow up in 6 months.

## 2024-10-03 NOTE — Assessment & Plan Note (Addendum)
 Changing diclofenac  to meloxicam .

## 2024-10-03 NOTE — Assessment & Plan Note (Signed)
 TSH today.  Continue current dosing levothyroxine .  Will adjust if needed.

## 2024-10-03 NOTE — Assessment & Plan Note (Signed)
 Advised to use CeraVe and Aquaphor.

## 2024-10-03 NOTE — Assessment & Plan Note (Signed)
Stable. °-Continue amlodipine °

## 2024-10-07 LAB — HEMOGLOBIN A1C

## 2024-10-07 LAB — TSH: TSH: 2.47 u[IU]/mL (ref 0.450–4.500)

## 2024-10-09 ENCOUNTER — Ambulatory Visit: Payer: Self-pay | Admitting: Family Medicine

## 2024-10-10 ENCOUNTER — Other Ambulatory Visit: Payer: Self-pay

## 2024-10-10 DIAGNOSIS — E119 Type 2 diabetes mellitus without complications: Secondary | ICD-10-CM

## 2024-10-12 LAB — HEMOGLOBIN A1C
Est. average glucose Bld gHb Est-mCnc: 140 mg/dL
Hgb A1c MFr Bld: 6.5 % — ABNORMAL HIGH (ref 4.8–5.6)

## 2024-10-13 ENCOUNTER — Ambulatory Visit: Payer: Self-pay | Admitting: Family Medicine

## 2024-10-20 ENCOUNTER — Ambulatory Visit: Admitting: Family Medicine

## 2024-10-20 VITALS — BP 130/72 | HR 75 | Temp 97.7°F | Ht <= 58 in | Wt 134.0 lb

## 2024-10-20 DIAGNOSIS — J069 Acute upper respiratory infection, unspecified: Secondary | ICD-10-CM

## 2024-10-20 DIAGNOSIS — R509 Fever, unspecified: Secondary | ICD-10-CM

## 2024-10-20 LAB — INFLUENZA A AND B AG, IMMUNOASSAY
INFLUENZA A ANTIGEN: NOT DETECTED
INFLUENZA B ANTIGEN: NOT DETECTED

## 2024-10-20 MED ORDER — HYDROCODONE BIT-HOMATROP MBR 5-1.5 MG/5ML PO SOLN: 5.0000 mL | Freq: Three times a day (TID) | ORAL | 0 refills | Status: AC | PRN

## 2024-10-20 NOTE — Progress Notes (Signed)
 "  Subjective:    Patient ID: Jamie Torres, female    DOB: [birth year], 69 y.o.   MRN: [id number]  HPI  Patient symptoms began Saturday.  Symptoms include head congestion and rhinorrhea.  She reports a dull frontal sinus headache.  She reports chest congestion and constant cough.  She denies any shortness of breath or pleurisy or hemoptysis or purulent sputum.  She denies any high fevers or chills.  She had fevers the first day but those have subsided.  She denies any rigors or bodyaches.  Flu test today is negative Past Medical History:  Diagnosis Date   Arthritis    Hypercholesteremia    Hypertension    Hypothyroidism    Plantar fasciitis    TB (pulmonary tuberculosis)    Treated 20 years ago.   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     EYE SURGERY     LAPAROSCOPIC LYSIS OF ADHESIONS  01/12/2024   Procedure: LAPAROSCOPIC LYSIS OF ADHESIONS;  Surgeon: Marget Lenis, MD;  Location: [facility] OR;  Service: Gynecology;;   LAPAROSCOPIC VAGINAL HYSTERECTOMY WITH SALPINGO OOPHORECTOMY Bilateral 01/12/2024   Procedure: LAPAROSCOPIC ASSISTED VAGINAL HYSTERECTOMY WITH BILATERAL SALPINGO-OOPHORECTOMY;  Surgeon: Marget Lenis, MD;  Location: [facility] OR;  Service: Gynecology;  Laterality: Bilateral;   TUBAL LIGATION     Medications Ordered Prior to Encounter[1] Allergies[2] Social History   Socioeconomic History   Marital status: Married    Spouse name: Not on file   Number of children: Not on file   Years of education: Not on file   Highest education level: Not on file  Occupational History   Not on file  Tobacco Use   Smoking status: Never   Smokeless tobacco: Never  Vaping Use   Vaping status: Never Used   Substance and Sexual Activity   Alcohol use: No   Drug use: No   Sexual activity: Not on file  Other Topics Concern   Not on file  Social History Narrative   Not on file   Social Drivers of Health   Tobacco Use: Low Risk (10/03/2024)   Patient History    Smoking Tobacco Use: Never    Smokeless Tobacco Use: Never    Passive Exposure: Not on file  Financial Resource Strain: Not on file  Food Insecurity: No Food Insecurity (01/12/2024)   Hunger Vital Sign    Worried About Running Out of Food in the Last Year: Never true    Ran Out of Food in the Last Year: Never true  Transportation Needs: No Transportation Needs (01/12/2024)   PRAPARE - Administrator, Civil Service (Medical): No    Lack of Transportation (Non-Medical): No  Physical Activity: Not on file  Stress: Not on file  Social Connections: Socially Integrated (01/12/2024)   Social Connection and Isolation Panel    Frequency of Communication with Friends and Family: More than three times a week    Frequency of Social Gatherings with Friends and Family: More than three times a week    Attends Religious Services: More than 4 times per year    Active Member of [employer] or Organizations: Yes    Attends Banker Meetings: More than 4 times per year  Marital Status: Married  Catering Manager Violence: Not At Risk (01/12/2024)   Humiliation, Afraid, Rape, and Kick questionnaire    Fear of Current or Ex-Partner: No    Emotionally Abused: No    Physically Abused: No    Sexually Abused: No  Depression (PHQ2-9): Low Risk (10/03/2024)   Depression (PHQ2-9)    PHQ-2 Score: 0  Alcohol Screen: Not on file  Housing: Low Risk (01/12/2024)   Housing Stability Vital Sign    Unable to Pay for Housing in the Last Year: No    Number of Times Moved in the Last Year: 0    Homeless in the Last Year: No  Utilities: Not At Risk (01/12/2024)   [facility] Utilities    Threatened with loss of utilities: No  Health Literacy: Not on file      Review of Systems     Objective:   Physical Exam Constitutional:      General: She is not in acute distress.    Appearance: Normal appearance. She is normal weight. She is not ill-appearing or toxic-appearing.  HENT:     Right Ear: Tympanic membrane and ear canal normal.     Left Ear: Tympanic membrane and ear canal normal.     Nose: Congestion and rhinorrhea present.     Mouth/Throat:     Pharynx: No oropharyngeal exudate or posterior oropharyngeal erythema.  Eyes:     Conjunctiva/sclera: Conjunctivae normal.  Cardiovascular:     Rate and Rhythm: Normal rate and regular rhythm.     Heart sounds: Normal heart sounds. No murmur heard. Pulmonary:     Effort: Pulmonary effort is normal. No respiratory distress.     Breath sounds: Normal breath sounds. No wheezing, rhonchi or rales.  Lymphadenopathy:     Cervical: No cervical adenopathy.  Neurological:     Mental Status: She is alert.           Assessment & Plan:  Fever, unspecified fever cause - Plan: Influenza A and B Ag, Immunoassay  Viral upper respiratory tract infection Patient has a viral upper respiratory infection.  Recommended Mucinex for chest congestion.  She can use Tylenol  as needed for body aches or fevers.  She can use Hycodan 1 teaspoon every 8 hours as needed for cough.  Anticipate gradual improvement over the next 4 to 5 days.     [1]  Current Outpatient Medications on File Prior to Visit  Medication Sig Dispense Refill   alendronate  (FOSAMAX ) 70 MG tablet Take 1 tablet (70 mg total) by mouth every 7 (seven) days. Take with a full glass of water on an empty stomach. 12 tablet 3   amLODipine  (NORVASC ) 5 MG tablet TAKE 1 TABLET(5 MG) BY MOUTH DAILY 90 tablet 3   baclofen  (LIORESAL ) 10 MG tablet Take 0.5-1 tablets (5-10 mg total) by mouth 3 (three) times daily as needed (Pain in left thigh). 30 each 0   levothyroxine  (SYNTHROID ) 88 MCG tablet TAKE 1 TABLET(88 MCG) BY MOUTH DAILY 90 tablet 0    meloxicam  (MOBIC ) 15 MG tablet Take 1 tablet (15 mg total) by mouth daily as needed for pain. 30 tablet 3   metFORMIN  (GLUCOPHAGE ) 500 MG tablet Take 1 tablet (500 mg total) by mouth 2 (two) times daily with a meal. 180 tablet 3   Omega-3 Fatty Acids (OMEGA 3 PO) Take 1 capsule by mouth daily.     pantoprazole  (PROTONIX ) 40 MG tablet Take 1 tablet (40 mg total) by mouth daily as needed. (Patient taking  differently: Take 40 mg by mouth at bedtime.) 90 tablet 1   rosuvastatin  (CRESTOR ) 10 MG tablet Take 1 tablet (10 mg total) by mouth daily. 90 tablet 3   No current facility-administered medications on file prior to visit.  [2] No Known Allergies  "

## 2024-11-02 ENCOUNTER — Other Ambulatory Visit: Payer: Self-pay

## 2024-11-03 ENCOUNTER — Other Ambulatory Visit: Payer: Self-pay | Admitting: Family Medicine

## 2025-02-15 ENCOUNTER — Ambulatory Visit (HOSPITAL_COMMUNITY): Admit: 2025-02-15 | Admitting: Gastroenterology

## 2025-02-15 ENCOUNTER — Encounter (HOSPITAL_COMMUNITY): Payer: Self-pay

## 2025-02-15 SURGERY — MANOMETRY, ESOPHAGUS

## 2025-04-03 ENCOUNTER — Ambulatory Visit: Admitting: Family Medicine
